# Patient Record
Sex: Male | Born: 1950 | Race: White | Hispanic: No | Marital: Married | Smoking: Never smoker
Health system: Southern US, Community
[De-identification: ages and names within clinical notes are randomized; demographics above are authoritative.]

## PROBLEM LIST (undated history)

## (undated) DIAGNOSIS — E119 Type 2 diabetes mellitus without complications: Secondary | ICD-10-CM

## (undated) DIAGNOSIS — I10 Essential (primary) hypertension: Secondary | ICD-10-CM

## (undated) DIAGNOSIS — M329 Systemic lupus erythematosus, unspecified: Secondary | ICD-10-CM

## (undated) DIAGNOSIS — N289 Disorder of kidney and ureter, unspecified: Secondary | ICD-10-CM

## (undated) DIAGNOSIS — IMO0002 Reserved for concepts with insufficient information to code with codable children: Secondary | ICD-10-CM

## (undated) HISTORY — DX: Disorder of kidney and ureter, unspecified: N28.9

## (undated) HISTORY — PX: CHOLECYSTECTOMY: SHX55

## (undated) HISTORY — PX: GALLBLADDER SURGERY: SHX652

## (undated) HISTORY — DX: Essential (primary) hypertension: I10

## (undated) HISTORY — PX: OTHER SURGICAL HISTORY: SHX169

## (undated) HISTORY — PX: LITHOTRIPSY: SUR834

## (undated) HISTORY — DX: Type 2 diabetes mellitus without complications: E11.9

---

## 2007-04-19 ENCOUNTER — Emergency Department (HOSPITAL_COMMUNITY): Admission: EM | Admit: 2007-04-19 | Discharge: 2007-04-19 | Payer: Self-pay | Admitting: Emergency Medicine

## 2007-04-20 ENCOUNTER — Ambulatory Visit (HOSPITAL_COMMUNITY): Admission: RE | Admit: 2007-04-20 | Discharge: 2007-04-20 | Payer: Self-pay | Admitting: Emergency Medicine

## 2007-11-10 ENCOUNTER — Ambulatory Visit (HOSPITAL_COMMUNITY): Admission: RE | Admit: 2007-11-10 | Discharge: 2007-11-10 | Payer: Self-pay | Admitting: Urology

## 2007-11-19 ENCOUNTER — Ambulatory Visit (HOSPITAL_COMMUNITY): Admission: RE | Admit: 2007-11-19 | Discharge: 2007-11-19 | Payer: Self-pay | Admitting: Urology

## 2007-11-24 ENCOUNTER — Ambulatory Visit (HOSPITAL_COMMUNITY): Admission: RE | Admit: 2007-11-24 | Discharge: 2007-11-24 | Payer: Self-pay | Admitting: Urology

## 2007-11-26 ENCOUNTER — Ambulatory Visit: Payer: Self-pay | Admitting: Internal Medicine

## 2007-11-26 DIAGNOSIS — Z87442 Personal history of urinary calculi: Secondary | ICD-10-CM

## 2007-11-26 DIAGNOSIS — R918 Other nonspecific abnormal finding of lung field: Secondary | ICD-10-CM

## 2007-11-26 DIAGNOSIS — I10 Essential (primary) hypertension: Secondary | ICD-10-CM

## 2007-11-26 DIAGNOSIS — E785 Hyperlipidemia, unspecified: Secondary | ICD-10-CM | POA: Insufficient documentation

## 2007-12-07 ENCOUNTER — Emergency Department (HOSPITAL_COMMUNITY): Admission: EM | Admit: 2007-12-07 | Discharge: 2007-12-07 | Payer: Self-pay | Admitting: Emergency Medicine

## 2007-12-19 ENCOUNTER — Ambulatory Visit (HOSPITAL_COMMUNITY): Admission: RE | Admit: 2007-12-19 | Discharge: 2007-12-19 | Payer: Self-pay | Admitting: Urology

## 2007-12-29 ENCOUNTER — Ambulatory Visit (HOSPITAL_COMMUNITY): Admission: RE | Admit: 2007-12-29 | Discharge: 2007-12-29 | Payer: Self-pay | Admitting: Urology

## 2008-01-14 ENCOUNTER — Ambulatory Visit (HOSPITAL_COMMUNITY): Admission: RE | Admit: 2008-01-14 | Discharge: 2008-01-14 | Payer: Self-pay | Admitting: Urology

## 2008-01-16 ENCOUNTER — Ambulatory Visit (HOSPITAL_COMMUNITY): Admission: RE | Admit: 2008-01-16 | Discharge: 2008-01-16 | Payer: Self-pay | Admitting: Urology

## 2008-04-20 ENCOUNTER — Encounter: Payer: Self-pay | Admitting: Internal Medicine

## 2008-10-18 ENCOUNTER — Emergency Department (HOSPITAL_COMMUNITY): Admission: EM | Admit: 2008-10-18 | Discharge: 2008-10-18 | Payer: Self-pay | Admitting: Emergency Medicine

## 2009-02-07 ENCOUNTER — Ambulatory Visit: Payer: Self-pay | Admitting: Internal Medicine

## 2009-02-07 IMAGING — CR DG ABDOMEN 1V
1 series · 1 of 1 positions shown · non-contrast
Comparison: 01/14/2008

CLINICAL DATA: Right kidney stone.  Lithotripsy.

ABDOMEN - 1 VIEW

[view not recorded]
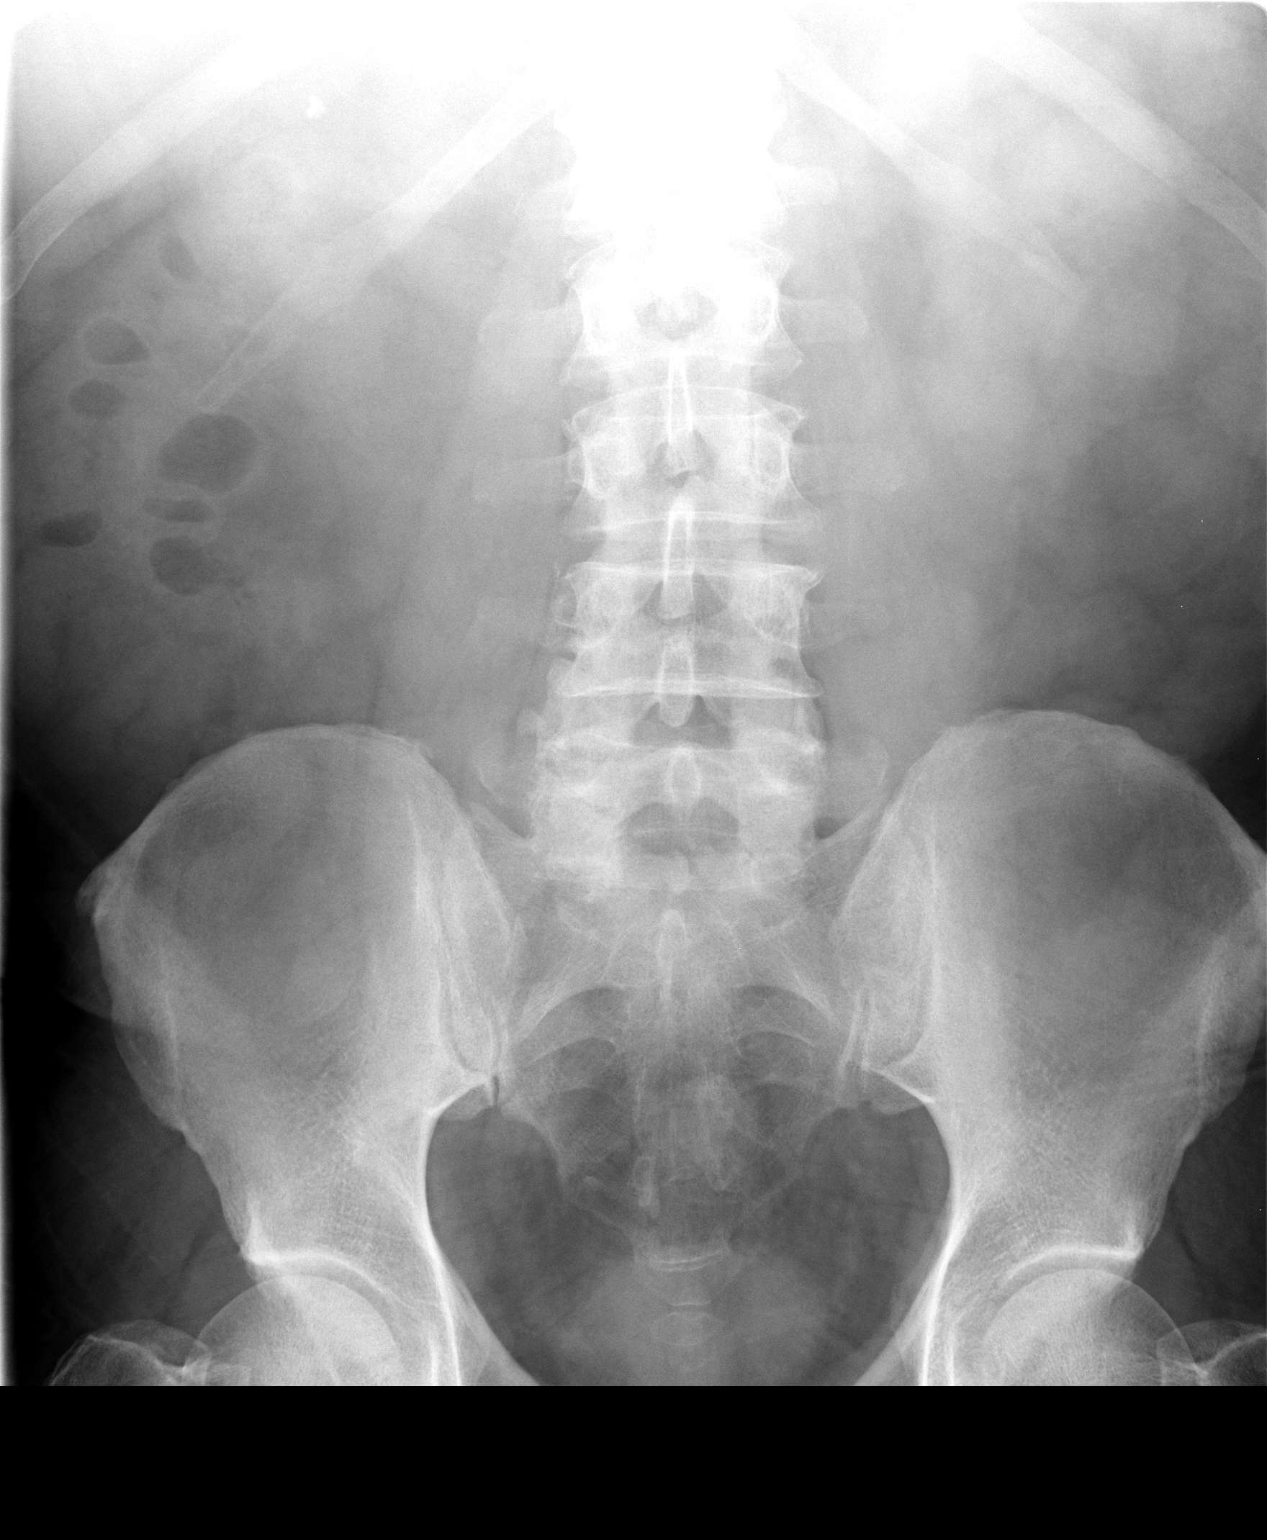

[1 of 1 positions shown; findings below may reference images not displayed]

FINDINGS: Right interpolar renal calculus is present, which has
fragmented compared to the prior exam of [DATE].   In aggregate, the
fragments measure 6 mm x 4 mm, previously measured a 7 mm x 4 mm.
No definite ureteral stone is identified bony spur is again noted
superior to the right L5 transverse process.
IMPRESSION: 1.  Fragmentation of right interpolar renal collecting systems
stones.

## 2010-08-29 LAB — URINALYSIS, ROUTINE W REFLEX MICROSCOPIC
Bilirubin Urine: NEGATIVE
Glucose, UA: NEGATIVE mg/dL
Hgb urine dipstick: NEGATIVE
Ketones, ur: NEGATIVE mg/dL
Protein, ur: NEGATIVE mg/dL

## 2010-08-29 LAB — DIFFERENTIAL
Basophils Relative: 1 % (ref 0–1)
Eosinophils Relative: 5 % (ref 0–5)
Lymphs Abs: 3.4 10*3/uL (ref 0.7–4.0)
Monocytes Relative: 8 % (ref 3–12)
Neutrophils Relative %: 39 % — ABNORMAL LOW (ref 43–77)

## 2010-08-29 LAB — CBC
HCT: 48.5 % (ref 39.0–52.0)
Hemoglobin: 16.5 g/dL (ref 13.0–17.0)
RBC: 5.63 MIL/uL (ref 4.22–5.81)
RDW: 13.1 % (ref 11.5–15.5)
WBC: 7.1 10*3/uL (ref 4.0–10.5)

## 2010-08-29 LAB — COMPREHENSIVE METABOLIC PANEL
ALT: 48 U/L (ref 0–53)
Albumin: 3.8 g/dL (ref 3.5–5.2)
GFR calc Af Amer: >60 mL/min (ref >60)
GFR calc non Af Amer: >60 mL/min (ref >60)
Sodium: 143 mEq/L (ref 135–145)

## 2010-08-29 LAB — URINE MICROSCOPIC-ADD ON

## 2010-08-29 LAB — LIPASE, BLOOD: Lipase: 20 U/L (ref 11–59)

## 2010-10-03 NOTE — H&P (Signed)
[name], [name]             ACCOUNT NO.:  [id number]   MEDICAL RECORD NO.:  [id number]          PATIENT TYPE:  AMB   LOCATION:  DAY                           FACILITY:  [facility]   PHYSICIAN:  [name], M.D.   DATE OF BIRTH:  December 31, 1950   DATE OF ADMISSION:  01/14/2008  DATE OF DISCHARGE:  LH                              HISTORY & PHYSICAL   CHIEF COMPLAINT:  Intermittent right flank pain, hematuria, right renal  calculus.   HISTORY OF PRESENT ILLNESS:  This 60 year old male was referred to me by  Dr. Sherril Vargas.  He has a history of recurrent urolithiasis for about 23  years.  He has passed several stones.  He was evaluated for intermittent  gross hematuria and right flank pain a few months ago.  He had 2 stones  in the right kidney.  Obstructing right lower pole renal calculus has  been treated with ESL in July 2009.  The stone has been fragmented well  and the patient has passed all the stone fragments.  He has another  stone 8 mm in size in the right upper pole of the kidney.  The patient  is brought to the short-stay center today for extracorporeal shock-wave  lithotripsy of the upper pole right renal calculus.  The patient denied  having any fever, chills, voiding difficulty or gross hematuria at  present.   PAST MEDICAL HISTORY:  1. Recurrent urolithiasis status post right nephrostolithotomy 23      years ago.  2. Status post ESL of right lower pole renal calculus in July 2009.  3. History of borderline  hypertension.   MEDICATIONS:  None.   ALLERGIES:  None.   EXAMINATION:  Height 5 feet 8, weight 198 pounds.  HEAD, EYES, EARS, NOSE AND THROAT: Normal.  LUNGS:  Clear to auscultation.  HEART: Regular rate and rhythm.  No murmurs.  ABDOMEN:  Soft, no palpable flank mass or CVA tenderness.  Bladder is  not palpable.  Penis normal.  Right testis is of normal size.  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  RECTAL EXAMINATION:  33 g size benign prostate.   IMPRESSION:  Right renal calculus, 8 mm.   PLAN:  Extracorporeal shock-wave lithotripsy of the right renal calculi  with IV sedation in short-stay center.  I have informed the patient  regarding the diagnosis, operative details, alternative treatments,  outcome, the possible risks and complications and he has agreed for the  procedure to be done.      [name], M.D.  Electronically Signed     SK/MEDQ  D:  01/14/2008  T:  01/14/2008  Job:  [id number]   cc:   Dr. Sherril Vargas

## 2010-10-03 NOTE — H&P (Signed)
[name], [name]             ACCOUNT NO.:  [id number]   MEDICAL RECORD NO.:  [id number]          PATIENT TYPE:  AMB   LOCATION:  DAY                           FACILITY:  [facility]   PHYSICIAN:  Dennie Maizes, M.D.   DATE OF BIRTH:  1950/10/14   DATE OF ADMISSION:  11/19/2007  DATE OF DISCHARGE:  LH                              HISTORY & PHYSICAL   CHIEF COMPLAINT:  Intermittent right flank pain, hematuria, large right  renal calculus.   HISTORY OF PRESENT ILLNESS:  This 60 year old male was referred to me by  Dr. Sherril Croon.  He has a history of recurrent urolithiasis for about 23  years.  He has passed several stones.  He passed a stone about 2 months  ago.  He has undergone right kidney surgery for stones about 25 years  ago.   He has been having intermittent right flank pain of moderate severity  for the past 6 months.  He has noticed intermittent mild hematuria for a  few weeks.  He denied having any voiding difficulty, dysuria, fever, or  chills.  He has urinary frequency x3-4 and nocturia x0.  He has good  urinary flow.  There is no past history of chronic prostatitis or  recurrent urinary tract infections.   PAST MEDICAL HISTORY:  1. Recurrent urolithiasis, status post right nephrostolithotomy 25      years ago.  2. Borderline hypertension.   MEDICATIONS:  None.   ALLERGIES:  None.   FAMILY HISTORY:  Positive for carcinoma of unknown site.   PHYSICAL EXAMINATION:  VITAL SIGNS:  Height 5 feet 8 inches, weight 198  pounds.  HEENT:  Normal.  LUNGS:  Clear to auscultation.  HEART:  Regular rate and rhythm.  No murmurs.  ABDOMEN:  Soft.  There is no palpable flank mass or costovertebral angle  tenderness.  Bladder is not palpable.  GU:  Penis normal.  Right testis is of normal size.  Left testis is  atrophic due to childhood mumps.  RECTAL:  33 g benign prostate.   Further evaluation was done with a CT scan of the abdomen and pelvis  with and without contrast.  This  revealed a 13 x 13 mm size right renal  stone with mild obstruction.  There is a 4 mm size nonobstructing stone  that was also noted in the upper pole of the right kidney.   IMPRESSION:  1. Right renal calculi.  2. Right flank pain.  3. Hematuria.   PLAN:  I have discussed with the patient regarding the diagnosis and  management options.  He is scheduled to undergo extracorporeal shock  wave lithotripsy of the large obstructing right renal calculus.  I have  informed him regarding the diagnosis, operative details, alternative  treatments, outcome, possible risks, and complications, and has agreed  for the procedure to be done.  He may need an additional procedure for  obstructing stone fragments, and this has been explained to the patient.      Dennie Maizes, M.D.  Electronically Signed     SK/MEDQ  D:  11/19/2007  T:  11/19/2007  Job:  [id number]   cc:   Doreen Beam, MD  Fax: [phone]   [facility]  Fax: [phone]

## 2011-02-27 LAB — LIPASE, BLOOD: Lipase: 21

## 2011-02-27 LAB — DIFFERENTIAL
Basophils Absolute: 0
Basophils Relative: 0
Eosinophils Absolute: 0.3
Eosinophils Relative: 4
Lymphocytes Relative: 28
Lymphs Abs: 2.8
Monocytes Absolute: 0.7
Monocytes Relative: 7
Neutro Abs: 6

## 2011-02-27 LAB — URINE MICROSCOPIC-ADD ON

## 2011-02-27 LAB — CBC
Hemoglobin: 16.3
MCV: 86.2
RDW: 13

## 2011-02-27 LAB — URINALYSIS, ROUTINE W REFLEX MICROSCOPIC
Urobilinogen, UA: 0.2
pH: 6

## 2011-02-27 LAB — BASIC METABOLIC PANEL
BUN: 13
Chloride: 101
GFR calc Af Amer: >60
Potassium: 4

## 2013-09-25 ENCOUNTER — Institutional Professional Consult (permissible substitution): Payer: Self-pay | Admitting: Internal Medicine

## 2014-04-21 ENCOUNTER — Ambulatory Visit: Payer: Self-pay | Admitting: Ophthalmology

## 2014-09-11 NOTE — Op Note (Signed)
PATIENT NAME:  [name], [name] MR#:  [id number] DATE OF BIRTH:  1951-05-13  DATE OF PROCEDURE:  04/21/2014  PREOPERATIVE DIAGNOSES:   1.  Rhegmatogenous retinal detachment, left eye.  2.  Retinal tear, right eye.  POSTOPERATIVE DIAGNOSES:  1.  Rhegmatogenous retinal detachment, left eye.  2.  Retinal tear, right eye.  PROCEDURE:  1.  A 23-gauge pars plana vitrectomy with membrane peeling, triamcinolone, air-fluid exchange, endolaser and 28% SF6 in the left eye.  2.  Laser barricade for retinal tear, right eye.  DESCRIPTION OF PROCEDURE: The patient was examined in the clinic for chronic- appearing retinal detachment in the left eye. 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 There was no active retinopathy and the patient did have some peripheral laser in place. The patient was noted to have a temporal macula off chronic- appearing retinal detachment. Of note, on exam there was also a retinal tear in the left eye. Risks, benefits, alternatives, and complications were discussed with patient, decision was made to proceed with pars plana vitrectomy of the left eye and laser barricade of the right eye.   On the day of procedure, the patient was greeted in the preoperative holding area. The left eye was marked for surgery and the right eye was marked for laser only. Any questions were answered. The patient was brought into the operating room in supine position and placed under general anesthesia. The left eye was then prepped and draped in the usual sterile fashion. A 25-gauge trocar pack was used. The 3 trocars were placed in the usual position. The inferotemporal trocar was checked to make sure infusion was in the vitreous cavity before starting the infusion. A core vitrectomy was performed. Attention was turned to the posterior hyaloid which was then elevated off of the optic nerve and it was noted there were several points of the attachment of the  posterior hyaloid to the retina temporally along the macular arcades. The peripheral vitreous, which was opacified, was then trimmed 360 degrees. The 2 retinal holes were identified and marked and fluid-fluid exchange was performed to drain some of this retinal fluid underneath and then air-fluid exchange was used to flatten the retina. Laser was applied around both off the retinal tears and peripheral panretinal photocoagulation endolaser was applied 360 degrees to  the patient's retina given his history of central retinal vein occlusion and a few small peripheral hemorrhages. Four times the vitreous volume of 28% SF6 was infused into the eye followed by removal of the trocars. Subconjunctival cefuroxime and dexamethasone were injected followed by Neo-Poly-Dex ointment and the patient was patched and shielded.  Attention was drawn to the right eye in which indirect laser barricade was applied to the retinal tear. The retina was inspected 360 degrees with scleral depression. There were no other retinal tears. The patient was brought out of general anesthesia and taken to recovery area in stable condition.    ____________________________ Laban Emperor Oval Linsey, MD jdr:LT D: 04/21/2014 17:41:19 ET T: 04/21/2014 19:35:45 ET JOB#: [id number]  cc: Janett Billow D. Oval Linsey, MD, <Dictator> Alexia Freestone MD ELECTRONICALLY SIGNED 04/28/2014 16:47

## 2014-11-30 ENCOUNTER — Encounter (INDEPENDENT_AMBULATORY_CARE_PROVIDER_SITE_OTHER): Payer: Self-pay | Admitting: *Deleted

## 2014-12-09 ENCOUNTER — Other Ambulatory Visit (INDEPENDENT_AMBULATORY_CARE_PROVIDER_SITE_OTHER): Payer: Self-pay | Admitting: *Deleted

## 2014-12-09 ENCOUNTER — Encounter (INDEPENDENT_AMBULATORY_CARE_PROVIDER_SITE_OTHER): Payer: Self-pay | Admitting: *Deleted

## 2014-12-09 DIAGNOSIS — Z1211 Encounter for screening for malignant neoplasm of colon: Secondary | ICD-10-CM

## 2014-12-09 DIAGNOSIS — Z800 Family history of malignant neoplasm of digestive organs: Secondary | ICD-10-CM

## 2014-12-14 ENCOUNTER — Ambulatory Visit (INDEPENDENT_AMBULATORY_CARE_PROVIDER_SITE_OTHER)
Admission: RE | Admit: 2014-12-14 | Discharge: 2014-12-14 | Disposition: A | Discharge status: Home / Self Care | Payer: Medicare HMO | Source: Ambulatory Visit | Attending: Internal Medicine | Admitting: Internal Medicine

## 2014-12-14 ENCOUNTER — Ambulatory Visit (INDEPENDENT_AMBULATORY_CARE_PROVIDER_SITE_OTHER): Payer: Medicare HMO | Admitting: Internal Medicine

## 2014-12-14 ENCOUNTER — Encounter: Payer: Self-pay | Admitting: Internal Medicine

## 2014-12-14 ENCOUNTER — Other Ambulatory Visit (INDEPENDENT_AMBULATORY_CARE_PROVIDER_SITE_OTHER): Payer: Medicare HMO

## 2014-12-14 VITALS — BP 160/82 | HR 72 | Ht 68.0 in | Wt 208.0 lb

## 2014-12-14 DIAGNOSIS — I10 Essential (primary) hypertension: Secondary | ICD-10-CM | POA: Diagnosis not present

## 2014-12-14 DIAGNOSIS — R058 Other specified cough: Secondary | ICD-10-CM

## 2014-12-14 DIAGNOSIS — R918 Other nonspecific abnormal finding of lung field: Secondary | ICD-10-CM

## 2014-12-14 DIAGNOSIS — R05 Cough: Secondary | ICD-10-CM

## 2014-12-14 LAB — CBC WITH DIFFERENTIAL/PLATELET
BASOS ABS: 0 10*3/uL (ref 0.0–0.1)
BASOS PCT: 0.2 % (ref 0.0–3.0)
Eosinophils Absolute: 0.3 10*3/uL (ref 0.0–0.7)
Eosinophils Relative: 3.1 % (ref 0.0–5.0)
HEMATOCRIT: 47.4 % (ref 39.0–52.0)
Hemoglobin: 16.1 g/dL (ref 13.0–17.0)
LYMPHS ABS: 2.6 10*3/uL (ref 0.7–4.0)
Lymphocytes Relative: 32.5 % (ref 12.0–46.0)
MCHC: 33.9 g/dL (ref 30.0–36.0)
MCV: 85.3 fl (ref 78.0–100.0)
MONOS PCT: 7.8 % (ref 3.0–12.0)
Monocytes Absolute: 0.6 10*3/uL (ref 0.1–1.0)
NEUTROS PCT: 56.4 % (ref 43.0–77.0)
Neutro Abs: 4.6 10*3/uL (ref 1.4–7.7)
Platelets: 180 10*3/uL (ref 150.0–400.0)
RBC: 5.56 Mil/uL (ref 4.22–5.81)
RDW: 13.3 % (ref 11.5–15.5)
WBC: 8.1 10*3/uL (ref 4.0–10.5)

## 2014-12-14 LAB — BASIC METABOLIC PANEL
BUN: 13 mg/dL (ref 6–23)
CHLORIDE: 104 meq/L (ref 96–112)
CO2: 28 meq/L (ref 19–32)
Calcium: 10.2 mg/dL (ref 8.4–10.5)
Creatinine, Ser: 1.15 mg/dL (ref 0.40–1.50)
GFR: 67.56 mL/min (ref >60.00)
Glucose, Bld: 91 mg/dL (ref 70–99)
POTASSIUM: 4 meq/L (ref 3.5–5.1)
Sodium: 140 mEq/L (ref 135–145)

## 2014-12-14 MED ORDER — VALSARTAN-HYDROCHLOROTHIAZIDE 160-12.5 MG PO TABS: 1.0000 | ORAL_TABLET | Freq: Every day | ORAL | Status: DC

## 2014-12-14 NOTE — Patient Instructions (Signed)
Stop lisinopril and start valsartan 160 12.5 one daily   Please remember to go to the lab and x-ray department downstairs for your tests - we will call you with the results when they are available.     If not 100% better return in 6 weeks, if better tell your friends!!!

## 2014-12-14 NOTE — Progress Notes (Signed)
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 no comparison films available.  rec f/u cxr never done, referred himself back to pulmonary clinic 12/14/2014 for cough on ACEi     12/14/2014 1st [facility] office visit/ [name] [facility] era  Chief Complaint  Patient presents with  . Pulmonary Consult    Self referral. Pt states worsening SOB at all times. Also c/o productive cough at times with thick gray mucus. Pt's spouse states some wheezing at night too   on acei since first of 2016 with onset of cough spring 2015 and noct wheezing per wife but not per pt  Tickle in throat but not prod of significant excess/ purulent sputum, variable sob but not proportionate to ex   No obvious day to day or daytime variability or assoc  cp or chest tightness, or overt sinus or hb symptoms. No unusual exp hx or h/o childhood pna/ asthma or knowledge of premature birth.  Sleeping ok without nocturnal  or early am exacerbation  of respiratory  c/o's or need for noct saba. Also denies any obvious fluctuation of symptoms with weather or environmental changes or other aggravating or alleviating factors except as outlined above   Current Medications, Allergies, Complete Past Medical History, Past Surgical History, Family History, and Social History were reviewed in [employer] record.  ROS  The following are not active complaints unless bolded sore throat, dysphagia, dental problems, itching, sneezing,  nasal congestion or excess/ purulent secretions, ear ache,   fever, chills, sweats, unintended wt loss, classically pleuritic or exertional cp, hemoptysis,  orthopnea pnd or leg swelling,  presyncope, palpitations, abdominal pain, anorexia, nausea, vomiting, diarrhea  or change in bowel or bladder habits, change in stools or urine, dysuria,hematuria,  rash, arthralgias, visual complaints, headache, numbness, weakness or ataxia or problems with walking or coordination,  change in mood/affect or memory.            Review of Systems  Respiratory: Positive for cough.        Objective:   Physical Exam    amb moderately hoarse wm nad  Wt Readings from Last 3 Encounters:  12/14/14 208 lb (94.348 kg)  02/07/09 205 lb 8 oz (93.214 kg)  11/26/07 205 lb 4 oz (93.101 kg)    Vital signs reviewed  HEENT: nl dentition, turbinates, and orophanx. Nl external ear canals without cough reflex   NECK :  without JVD/Nodes/TM/ nl carotid upstrokes bilaterally   LUNGS: no acc muscle use, clear to A and P bilaterally without cough on insp or exp maneuvers   CV:  RRR  no s3 or murmur or increase in P2, no edema   ABD:  soft and nontender with nl excursion in the supine position. No bruits or organomegaly, bowel sounds nl  MS:  warm without deformities, calf tenderness, cyanosis or clubbing  SKIN: warm and dry without lesions    NEURO:  alert, approp, no deficits    CXR PA and Lateral:   12/14/2014 :     I personally reviewed images and agree with radiology impression as follows:    No active cardiopulmonary disease.  Labs ordered/ reviewed:  Lab 12/14/14 1559  NA 140  K 4.0  CL 104  CO2 28  BUN 13  CREATININE 1.15  GLUCOSE 91   Lab 12/14/14 1559  HGB 16.1  HCT 47.4  WBC 8.1  PLT 180.0          Assessment & Plan:

## 2014-12-15 ENCOUNTER — Telehealth: Payer: Self-pay | Admitting: Internal Medicine

## 2014-12-15 ENCOUNTER — Encounter: Payer: Self-pay | Admitting: Internal Medicine

## 2014-12-15 NOTE — Telephone Encounter (Signed)
PT informed of lab and cxr results per Dr Melvyn Novas.

## 2014-12-15 NOTE — Assessment & Plan Note (Signed)
ACE inhibitors are problematic in  pts with airway complaints because  even experienced pulmonologists can't always distinguish ace effects from copd/asthma.  By themselves they don't actually cause a problem, much like oxygen can't by itself start a fire, but they certainly serve as a powerful catalyst or enhancer for any "fire"  or inflammatory process in the upper airway, be it caused by an ET  tube or more commonly reflux (especially in the obese or pts with known GERD or who are on biphoshonates).    In the era of ARB near equivalency until we have a better handle on the reversibility of the airway problem, it just makes sense to avoid ACEI  entirely in the short run and then decide later, having established a level of airway control using a reasonable limited regimen, whether to add back ace but even then being very careful to observe the pt for worsening airway control and number of meds used/ needed to control symptoms.    rec trial of diovan 160 12.5 and f/u primary care if cough gone, here if not

## 2014-12-15 NOTE — Assessment & Plan Note (Signed)
The most common causes of chronic cough in immunocompetent adults include the following: upper airway cough syndrome (UACS), previously referred to as postnasal drip syndrome (PNDS), which is caused by variety of rhinosinus conditions; (2) asthma; (3) GERD; (4) chronic bronchitis from cigarette smoking or other inhaled environmental irritants; (5) nonasthmatic eosinophilic bronchitis; and (6) bronchiectasis.   These conditions, singly or in combination, have accounted for up to 94% of the causes of chronic cough in prospective studies.   Other conditions have constituted no >6% of the causes in prospective studies These have included bronchogenic carcinoma, chronic interstitial pneumonia, sarcoidosis, left ventricular failure, ACEI-induced cough, and aspiration from a condition associated with pharyngeal dysfunction.    Chronic cough is often simultaneously caused by more than one condition. A single cause has been found from 38 to 82% of the time, multiple causes from 18 to 62%. Multiply caused cough has been the result of three diseases up to 42% of the time.       Based on hx and exam, this is most likely:  Classic Upper airway cough syndrome, so named because it's frequently impossible to sort out how much is  CR/sinusitis with freq throat clearing (which can be related to primary GERD)   vs  causing  secondary (" extra esophageal")  GERD from wide swings in gastric pressure that occur with throat clearing, often  promoting self use of mint and menthol lozenges that reduce the lower esophageal sphincter tone and exacerbate the problem further in a cyclical fashion.   These are the same pts (now being labeled as having "irritable larynx syndrome" by some cough centers) who not infrequently have a history of having failed to tolerate ace inhibitors,  dry powder inhalers or biphosphonates or report having atypical reflux symptoms that don't respond to standard doses of PPI , and are easily confused as  having aecopd or asthma flares by even experienced allergists/ pulmonologists.   The first step is to   eliminate ACEi  then regroup if the cough persists.  See instructions for specific recommendations which were reviewed directly with the patient who was given a copy with highlighter outlining the key components.

## 2014-12-15 NOTE — Assessment & Plan Note (Signed)
See CT chest 2009 x 8 mm  - not viz on plain cxr 12/14/14 / never smoker > no f/u rec

## 2015-01-20 ENCOUNTER — Telehealth (INDEPENDENT_AMBULATORY_CARE_PROVIDER_SITE_OTHER): Payer: Self-pay | Admitting: *Deleted

## 2015-01-20 DIAGNOSIS — Z1211 Encounter for screening for malignant neoplasm of colon: Secondary | ICD-10-CM

## 2015-01-20 MED ORDER — SUPREP BOWEL PREP KIT 17.5-3.13-1.6 GM/177ML PO SOLN: 1.0000 | Freq: Once | ORAL | Status: DC

## 2015-01-20 NOTE — Telephone Encounter (Signed)
Patient needs suprep 

## 2015-01-28 ENCOUNTER — Telehealth (INDEPENDENT_AMBULATORY_CARE_PROVIDER_SITE_OTHER): Payer: Self-pay | Admitting: *Deleted

## 2015-01-28 NOTE — Telephone Encounter (Signed)
Referring MD/PCP: shah   Procedure: tcs  Reason/Indication:  Screening, fam hx colon ca  Has patient had this procedure before?  no  If so, when, by whom and where?    Is there a family history of colon cancer?  Yes, paternal uncle  Who?  What age when diagnosed?    Is patient diabetic?   no      Does patient have prosthetic heart valve?  no  Do you have a pacemaker?  no  Has patient ever had endocarditis? no  Has patient had joint replacement within last 12 months?  no  Does patient tend to be constipated or take laxatives? no  Does patient have a history of alcohol/drug use? no  Is patient on Coumadin, Plavix and/or Aspirin? no  Medications: lisinopril/hctz 10/12.5 mg daily, plaquenil 200 mg bid  Allergies: nkda  Medication Adjustment:   Procedure date & time: 02/23/15 at 855

## 2015-02-01 NOTE — Telephone Encounter (Signed)
agree

## 2015-02-23 ENCOUNTER — Ambulatory Visit (HOSPITAL_COMMUNITY)
Admission: RE | Admit: 2015-02-23 | Discharge: 2015-02-23 | Disposition: A | Discharge status: Home / Self Care | Payer: Medicare HMO | Source: Ambulatory Visit | Attending: Internal Medicine | Admitting: Internal Medicine

## 2015-02-23 ENCOUNTER — Encounter (HOSPITAL_COMMUNITY): Payer: Self-pay | Admitting: *Deleted

## 2015-02-23 ENCOUNTER — Encounter (HOSPITAL_COMMUNITY)
Admission: RE | Disposition: A | Discharge status: Home / Self Care | Payer: Self-pay | Source: Ambulatory Visit | Attending: Internal Medicine

## 2015-02-23 DIAGNOSIS — D125 Benign neoplasm of sigmoid colon: Secondary | ICD-10-CM | POA: Diagnosis not present

## 2015-02-23 DIAGNOSIS — K644 Residual hemorrhoidal skin tags: Secondary | ICD-10-CM | POA: Insufficient documentation

## 2015-02-23 DIAGNOSIS — Z800 Family history of malignant neoplasm of digestive organs: Secondary | ICD-10-CM | POA: Insufficient documentation

## 2015-02-23 DIAGNOSIS — Z79899 Other long term (current) drug therapy: Secondary | ICD-10-CM | POA: Diagnosis not present

## 2015-02-23 DIAGNOSIS — Z1211 Encounter for screening for malignant neoplasm of colon: Secondary | ICD-10-CM | POA: Insufficient documentation

## 2015-02-23 DIAGNOSIS — I10 Essential (primary) hypertension: Secondary | ICD-10-CM | POA: Insufficient documentation

## 2015-02-23 DIAGNOSIS — K648 Other hemorrhoids: Secondary | ICD-10-CM | POA: Diagnosis not present

## 2015-02-23 DIAGNOSIS — D123 Benign neoplasm of transverse colon: Secondary | ICD-10-CM | POA: Diagnosis not present

## 2015-02-23 HISTORY — DX: Systemic lupus erythematosus, unspecified: M32.9

## 2015-02-23 HISTORY — DX: Reserved for concepts with insufficient information to code with codable children: IMO0002

## 2015-02-23 HISTORY — PX: COLONOSCOPY: SHX5424

## 2015-02-23 SURGERY — COLONOSCOPY
Anesthesia: Moderate Sedation

## 2015-02-23 MED ORDER — MIDAZOLAM HCL 5 MG/5ML IJ SOLN
INTRAMUSCULAR | Status: DC | PRN
  Administered 2015-02-23: 2 mg via INTRAVENOUS
  Administered 2015-02-23: 1 mg via INTRAVENOUS
  Administered 2015-02-23: 2 mg via INTRAVENOUS

## 2015-02-23 MED ORDER — MEPERIDINE HCL 50 MG/ML IJ SOLN
INTRAMUSCULAR | Status: DC | PRN
  Administered 2015-02-23 (×2): 25 mg via INTRAVENOUS

## 2015-02-23 MED ORDER — STERILE WATER FOR IRRIGATION IR SOLN
Status: DC | PRN
  Administered 2015-02-23: 09:00:00

## 2015-02-23 MED ORDER — MIDAZOLAM HCL 5 MG/5ML IJ SOLN
INTRAMUSCULAR | Status: AC
  Filled 2015-02-23: qty 10

## 2015-02-23 MED ORDER — SODIUM CHLORIDE 0.9 % IV SOLN
INTRAVENOUS | Status: DC
  Administered 2015-02-23: 08:00:00 via INTRAVENOUS

## 2015-02-23 MED ORDER — MEPERIDINE HCL 50 MG/ML IJ SOLN
INTRAMUSCULAR | Status: AC
  Filled 2015-02-23: qty 1

## 2015-02-23 NOTE — Op Note (Addendum)
COLONOSCOPY PROCEDURE REPORT  PATIENT:  FLORENTINO LAABS  MR#:  [id number] Birthdate:  1950/09/23, 64 y.o., male Endoscopist:  Dr. Rogene Houston, MD Referred By:  Dr. Monico Blitz, MD  Procedure Date: 02/23/2015  Procedure:   Colonoscopy with snare polypectomy  Indications:  Patient is 64 year old Caucasian male was undergoing average risk screening colonoscopy. This is patient's first exam.  Informed Consent:  The procedure and risks were reviewed with the patient and informed consent was obtained.  Medications:  Demerol 50 mg IV Versed 5 mg IV  Description of procedure:  After a digital rectal exam was performed, that colonoscope was advanced from the anus through the rectum and colon to the area of the cecum, ileocecal valve and appendiceal orifice. The cecum was deeply intubated. These structures were well-seen and photographed for the record. From the level of the cecum and ileocecal valve, the scope was slowly and cautiously withdrawn. The mucosal surfaces were carefully surveyed utilizing scope tip to flexion to facilitate fold flattening as needed. The scope was pulled down into the rectum where a thorough exam including retroflexion was performed.  Findings:   Prep excellent. 4 mm polyp ablated via cold biopsy from splenic flexure. 5 mm polyp cold snared from proximal sigmoid colon. 7 mm polyp hot snared from distal sigmoid colon. Normal rectal mucosa. Small hemorrhoids below the dentate line and a tiny anal papilla.   Therapeutic/Diagnostic Maneuvers Performed:  See above  Complications: None  EBL: None  Cecal Withdrawal Time:  22 minutes  Impression:  Examination performed to cecum. 4 mm polyp removed with cold biopsy forceps from splenic flexure 5 mm polyp cold snared from proximal sigmoid colon. 7 mm  polyp hot snared from distal sigmoid colon. All three polyps were submitted together. Small external hemorrhoids and single anal papilla.  Recommendations:   Standard instructions given. I will contact patient with biopsy results and further recommendations.  Jazmine Heckman U  02/23/2015 9:38 AM  CC: Dr. Monico Blitz, MD & Dr. Rayne Du ref. provider found

## 2015-02-23 NOTE — Discharge Instructions (Signed)
No aspirin or NSAIDs for 1 week. Resume usual medications and diet. No driving for 24 hours. Physician will call with biopsy results.  Colonoscopy, Care After These instructions give you information on caring for yourself after your procedure. Your doctor may also give you more specific instructions. Call your doctor if you have any problems or questions after your procedure. HOME CARE  Do not drive for 24 hours.  Do not sign important papers or use machinery for 24 hours.  You may shower.  You may go back to your usual activities, but go slower for the first 24 hours.  Take rest breaks often during the first 24 hours.  Walk around or use warm packs on your belly (abdomen) if you have belly cramping or gas.  Drink enough fluids to keep your pee (urine) clear or pale yellow.  Resume your normal diet. Avoid heavy or fried foods.  Avoid drinking alcohol for 24 hours or as told by your doctor.  Only take medicines as told by your doctor. If a tissue sample (biopsy) was taken during the procedure:   Do not take aspirin or blood thinners for 7 days, or as told by your doctor.  Do not drink alcohol for 7 days, or as told by your doctor.  Eat soft foods for the first 24 hours. GET HELP IF: You still have a small amount of blood in your poop (stool) 2-3 days after the procedure. GET HELP RIGHT AWAY IF:  You have more than a small amount of blood in your poop.  You see clumps of tissue (blood clots) in your poop.  Your belly is puffy (swollen).  You feel sick to your stomach (nauseous) or throw up (vomit).  You have a fever.  You have belly pain that gets worse and medicine does not help. MAKE SURE YOU:  Understand these instructions.  Will watch your condition.  Will get help right away if you are not doing well or get worse.   This information is not intended to replace advice given to you by your health care provider. Make sure you discuss any questions you have  with your health care provider.   Document Released: 06/09/2010 Document Revised: 05/12/2013 Document Reviewed: 01/12/2013 Elsevier Interactive Patient Education [employer].

## 2015-02-23 NOTE — H&P (Signed)
[name] is an 64 y.o. male.   Chief Complaint: Patient is here for colonoscopy. HPI: Patient is 64 year old Caucasian male who is here for screening colonoscopy. He denies abdominal pain change in bowel habits or rectal bleeding. Family history is negative for CRC. His uncle had stomach cancer and not colon cancer. This is patient's first colonoscopy.  Past Medical History  Diagnosis Date  . Hypertension   . Kidney disease     kidney stones  . Lupus [facility])     Past Surgical History  Procedure Laterality Date  . Gallbladder surgery    . Cholecystectomy    . Lithotripsy    . Kidney stone removal      Family History  Problem Relation Age of Onset  . Lung cancer Father   . Breast cancer Paternal Aunt    Social History:  reports that he has never smoked. He does not have any smokeless tobacco history on file. He reports that he does not drink alcohol or use illicit drugs.  Allergies: No Known Allergies  Medications Prior to Admission  Medication Sig Dispense Refill  . hydroxychloroquine (PLAQUENIL) 200 MG tablet Take 1 tablet by mouth 2 (two) times daily.    [name] BOWEL PREP SOLN Take 1 kit by mouth once. 1 Bottle 0  . valsartan-hydrochlorothiazide (DIOVAN HCT) 160-12.5 MG per tablet Take 1 tablet by mouth daily. 30 tablet 11    No results found for this or any previous visit (from the past 48 hour(s)). No results found.  ROS  Blood pressure 147/72, pulse 72, temperature 97.7 F (36.5 C), temperature source Oral, resp. rate 15, height 5' 7"  (1.702 m), weight 210 lb (95.255 kg), SpO2 96 %. Physical Exam  Constitutional: He appears well-developed and well-nourished.  HENT:  Mouth/Throat: Oropharynx is clear and moist.  Eyes: Conjunctivae are normal. No scleral icterus.  Neck: No thyromegaly present.  Cardiovascular: Normal rate, regular rhythm and normal heart sounds.   No murmur heard. Respiratory: Effort normal and breath sounds normal.  GI: Soft. He  exhibits no distension and no mass. There is no tenderness.  Musculoskeletal: He exhibits no edema.  Lymphadenopathy:    He has no cervical adenopathy.  Neurological: He is alert.  Skin: Skin is warm and dry.     Assessment/Plan Average risk screening colonoscopy.  [name] U 02/23/2015, 8:57 AM

## 2015-02-24 ENCOUNTER — Encounter (HOSPITAL_COMMUNITY): Payer: Self-pay | Admitting: Internal Medicine

## 2016-02-14 ENCOUNTER — Other Ambulatory Visit: Payer: Self-pay | Admitting: Internal Medicine

## 2016-02-14 DIAGNOSIS — I10 Essential (primary) hypertension: Secondary | ICD-10-CM

## 2016-09-26 DIAGNOSIS — Z299 Encounter for prophylactic measures, unspecified: Secondary | ICD-10-CM | POA: Diagnosis not present

## 2016-09-26 DIAGNOSIS — M109 Gout, unspecified: Secondary | ICD-10-CM | POA: Diagnosis not present

## 2016-09-26 DIAGNOSIS — Z789 Other specified health status: Secondary | ICD-10-CM | POA: Diagnosis not present

## 2016-09-26 DIAGNOSIS — E7800 Pure hypercholesterolemia, unspecified: Secondary | ICD-10-CM | POA: Diagnosis not present

## 2016-09-26 DIAGNOSIS — Z6835 Body mass index (BMI) 35.0-35.9, adult: Secondary | ICD-10-CM | POA: Diagnosis not present

## 2016-09-26 DIAGNOSIS — L930 Discoid lupus erythematosus: Secondary | ICD-10-CM | POA: Diagnosis not present

## 2016-09-26 DIAGNOSIS — I10 Essential (primary) hypertension: Secondary | ICD-10-CM | POA: Diagnosis not present

## 2016-10-29 DIAGNOSIS — H04123 Dry eye syndrome of bilateral lacrimal glands: Secondary | ICD-10-CM | POA: Diagnosis not present

## 2016-10-29 DIAGNOSIS — Z961 Presence of intraocular lens: Secondary | ICD-10-CM | POA: Diagnosis not present

## 2016-10-29 DIAGNOSIS — H349 Unspecified retinal vascular occlusion: Secondary | ICD-10-CM | POA: Diagnosis not present

## 2016-11-02 DIAGNOSIS — Z299 Encounter for prophylactic measures, unspecified: Secondary | ICD-10-CM | POA: Diagnosis not present

## 2016-11-02 DIAGNOSIS — E7800 Pure hypercholesterolemia, unspecified: Secondary | ICD-10-CM | POA: Diagnosis not present

## 2016-11-02 DIAGNOSIS — Z6835 Body mass index (BMI) 35.0-35.9, adult: Secondary | ICD-10-CM | POA: Diagnosis not present

## 2016-11-02 DIAGNOSIS — Z713 Dietary counseling and surveillance: Secondary | ICD-10-CM | POA: Diagnosis not present

## 2016-11-02 DIAGNOSIS — I10 Essential (primary) hypertension: Secondary | ICD-10-CM | POA: Diagnosis not present

## 2017-03-21 DIAGNOSIS — E669 Obesity, unspecified: Secondary | ICD-10-CM | POA: Diagnosis not present

## 2017-03-21 DIAGNOSIS — R69 Illness, unspecified: Secondary | ICD-10-CM | POA: Diagnosis not present

## 2017-03-21 DIAGNOSIS — Z299 Encounter for prophylactic measures, unspecified: Secondary | ICD-10-CM | POA: Diagnosis not present

## 2017-03-21 DIAGNOSIS — Z713 Dietary counseling and surveillance: Secondary | ICD-10-CM | POA: Diagnosis not present

## 2017-03-21 DIAGNOSIS — M109 Gout, unspecified: Secondary | ICD-10-CM | POA: Diagnosis not present

## 2017-03-21 DIAGNOSIS — E7800 Pure hypercholesterolemia, unspecified: Secondary | ICD-10-CM | POA: Diagnosis not present

## 2017-09-23 ENCOUNTER — Telehealth: Payer: Self-pay | Admitting: Orthopedic Surgery

## 2017-09-23 NOTE — Telephone Encounter (Signed)
Patient's wife called to request appointment for heel problem; offered first available appointment, and said will check around and call back if decides to schedule.

## 2017-11-27 DIAGNOSIS — Z6834 Body mass index (BMI) 34.0-34.9, adult: Secondary | ICD-10-CM | POA: Diagnosis not present

## 2017-11-27 DIAGNOSIS — I10 Essential (primary) hypertension: Secondary | ICD-10-CM | POA: Diagnosis not present

## 2017-11-27 DIAGNOSIS — Z299 Encounter for prophylactic measures, unspecified: Secondary | ICD-10-CM | POA: Diagnosis not present

## 2017-11-27 DIAGNOSIS — M7918 Myalgia, other site: Secondary | ICD-10-CM | POA: Diagnosis not present

## 2017-11-27 DIAGNOSIS — Z789 Other specified health status: Secondary | ICD-10-CM | POA: Diagnosis not present

## 2017-12-02 DIAGNOSIS — M47816 Spondylosis without myelopathy or radiculopathy, lumbar region: Secondary | ICD-10-CM | POA: Diagnosis not present

## 2017-12-02 DIAGNOSIS — M545 Low back pain: Secondary | ICD-10-CM | POA: Diagnosis not present

## 2017-12-02 DIAGNOSIS — M47817 Spondylosis without myelopathy or radiculopathy, lumbosacral region: Secondary | ICD-10-CM | POA: Diagnosis not present

## 2017-12-02 DIAGNOSIS — I700 Atherosclerosis of aorta: Secondary | ICD-10-CM | POA: Diagnosis not present

## 2017-12-02 DIAGNOSIS — N200 Calculus of kidney: Secondary | ICD-10-CM | POA: Diagnosis not present

## 2017-12-02 DIAGNOSIS — M5136 Other intervertebral disc degeneration, lumbar region: Secondary | ICD-10-CM | POA: Diagnosis not present

## 2017-12-20 DIAGNOSIS — Z6834 Body mass index (BMI) 34.0-34.9, adult: Secondary | ICD-10-CM | POA: Diagnosis not present

## 2017-12-20 DIAGNOSIS — Z713 Dietary counseling and surveillance: Secondary | ICD-10-CM | POA: Diagnosis not present

## 2017-12-20 DIAGNOSIS — L930 Discoid lupus erythematosus: Secondary | ICD-10-CM | POA: Diagnosis not present

## 2017-12-20 DIAGNOSIS — Z299 Encounter for prophylactic measures, unspecified: Secondary | ICD-10-CM | POA: Diagnosis not present

## 2017-12-20 DIAGNOSIS — I10 Essential (primary) hypertension: Secondary | ICD-10-CM | POA: Diagnosis not present

## 2017-12-27 DIAGNOSIS — Z6834 Body mass index (BMI) 34.0-34.9, adult: Secondary | ICD-10-CM | POA: Diagnosis not present

## 2017-12-27 DIAGNOSIS — E7800 Pure hypercholesterolemia, unspecified: Secondary | ICD-10-CM | POA: Diagnosis not present

## 2017-12-27 DIAGNOSIS — Z299 Encounter for prophylactic measures, unspecified: Secondary | ICD-10-CM | POA: Diagnosis not present

## 2017-12-27 DIAGNOSIS — I10 Essential (primary) hypertension: Secondary | ICD-10-CM | POA: Diagnosis not present

## 2017-12-27 DIAGNOSIS — G479 Sleep disorder, unspecified: Secondary | ICD-10-CM | POA: Diagnosis not present

## 2018-01-17 DIAGNOSIS — I10 Essential (primary) hypertension: Secondary | ICD-10-CM | POA: Diagnosis not present

## 2018-01-17 DIAGNOSIS — Z299 Encounter for prophylactic measures, unspecified: Secondary | ICD-10-CM | POA: Diagnosis not present

## 2018-01-17 DIAGNOSIS — Z713 Dietary counseling and surveillance: Secondary | ICD-10-CM | POA: Diagnosis not present

## 2018-01-17 DIAGNOSIS — Z6833 Body mass index (BMI) 33.0-33.9, adult: Secondary | ICD-10-CM | POA: Diagnosis not present

## 2018-04-07 DIAGNOSIS — R69 Illness, unspecified: Secondary | ICD-10-CM | POA: Diagnosis not present

## 2018-04-18 DIAGNOSIS — Z299 Encounter for prophylactic measures, unspecified: Secondary | ICD-10-CM | POA: Diagnosis not present

## 2018-04-18 DIAGNOSIS — Z6834 Body mass index (BMI) 34.0-34.9, adult: Secondary | ICD-10-CM | POA: Diagnosis not present

## 2018-04-18 DIAGNOSIS — I10 Essential (primary) hypertension: Secondary | ICD-10-CM | POA: Diagnosis not present

## 2018-04-18 DIAGNOSIS — M109 Gout, unspecified: Secondary | ICD-10-CM | POA: Diagnosis not present

## 2018-04-28 DIAGNOSIS — Z6834 Body mass index (BMI) 34.0-34.9, adult: Secondary | ICD-10-CM | POA: Diagnosis not present

## 2018-04-28 DIAGNOSIS — Z299 Encounter for prophylactic measures, unspecified: Secondary | ICD-10-CM | POA: Diagnosis not present

## 2018-04-28 DIAGNOSIS — E7800 Pure hypercholesterolemia, unspecified: Secondary | ICD-10-CM | POA: Diagnosis not present

## 2018-04-28 DIAGNOSIS — I10 Essential (primary) hypertension: Secondary | ICD-10-CM | POA: Diagnosis not present

## 2018-05-20 DIAGNOSIS — S39012A Strain of muscle, fascia and tendon of lower back, initial encounter: Secondary | ICD-10-CM | POA: Diagnosis not present

## 2018-05-20 DIAGNOSIS — I10 Essential (primary) hypertension: Secondary | ICD-10-CM | POA: Diagnosis not present

## 2018-05-20 DIAGNOSIS — Z299 Encounter for prophylactic measures, unspecified: Secondary | ICD-10-CM | POA: Diagnosis not present

## 2018-05-20 DIAGNOSIS — Z6834 Body mass index (BMI) 34.0-34.9, adult: Secondary | ICD-10-CM | POA: Diagnosis not present

## 2018-07-29 DIAGNOSIS — H349 Unspecified retinal vascular occlusion: Secondary | ICD-10-CM | POA: Diagnosis not present

## 2018-07-29 DIAGNOSIS — H524 Presbyopia: Secondary | ICD-10-CM | POA: Diagnosis not present

## 2018-10-08 DIAGNOSIS — B3742 Candidal balanitis: Secondary | ICD-10-CM | POA: Diagnosis not present

## 2018-10-08 DIAGNOSIS — I10 Essential (primary) hypertension: Secondary | ICD-10-CM | POA: Diagnosis not present

## 2018-10-08 DIAGNOSIS — Z6834 Body mass index (BMI) 34.0-34.9, adult: Secondary | ICD-10-CM | POA: Diagnosis not present

## 2018-10-08 DIAGNOSIS — Z789 Other specified health status: Secondary | ICD-10-CM | POA: Diagnosis not present

## 2018-10-08 DIAGNOSIS — Z299 Encounter for prophylactic measures, unspecified: Secondary | ICD-10-CM | POA: Diagnosis not present

## 2018-10-14 DIAGNOSIS — Z125 Encounter for screening for malignant neoplasm of prostate: Secondary | ICD-10-CM | POA: Diagnosis not present

## 2018-10-14 DIAGNOSIS — Z299 Encounter for prophylactic measures, unspecified: Secondary | ICD-10-CM | POA: Diagnosis not present

## 2018-10-14 DIAGNOSIS — B3742 Candidal balanitis: Secondary | ICD-10-CM | POA: Diagnosis not present

## 2018-10-14 DIAGNOSIS — Z713 Dietary counseling and surveillance: Secondary | ICD-10-CM | POA: Diagnosis not present

## 2018-10-14 DIAGNOSIS — R5383 Other fatigue: Secondary | ICD-10-CM | POA: Diagnosis not present

## 2018-10-14 DIAGNOSIS — Z6833 Body mass index (BMI) 33.0-33.9, adult: Secondary | ICD-10-CM | POA: Diagnosis not present

## 2018-10-14 DIAGNOSIS — I10 Essential (primary) hypertension: Secondary | ICD-10-CM | POA: Diagnosis not present

## 2018-11-04 DIAGNOSIS — Z1331 Encounter for screening for depression: Secondary | ICD-10-CM | POA: Diagnosis not present

## 2018-11-04 DIAGNOSIS — Z1339 Encounter for screening examination for other mental health and behavioral disorders: Secondary | ICD-10-CM | POA: Diagnosis not present

## 2018-11-04 DIAGNOSIS — G479 Sleep disorder, unspecified: Secondary | ICD-10-CM | POA: Diagnosis not present

## 2018-11-04 DIAGNOSIS — I10 Essential (primary) hypertension: Secondary | ICD-10-CM | POA: Diagnosis not present

## 2018-11-04 DIAGNOSIS — Z7189 Other specified counseling: Secondary | ICD-10-CM | POA: Diagnosis not present

## 2018-11-04 DIAGNOSIS — Z1211 Encounter for screening for malignant neoplasm of colon: Secondary | ICD-10-CM | POA: Diagnosis not present

## 2018-11-04 DIAGNOSIS — E7800 Pure hypercholesterolemia, unspecified: Secondary | ICD-10-CM | POA: Diagnosis not present

## 2018-11-04 DIAGNOSIS — Z6833 Body mass index (BMI) 33.0-33.9, adult: Secondary | ICD-10-CM | POA: Diagnosis not present

## 2018-11-04 DIAGNOSIS — Z79899 Other long term (current) drug therapy: Secondary | ICD-10-CM | POA: Diagnosis not present

## 2018-11-04 DIAGNOSIS — Z0000 Encounter for general adult medical examination without abnormal findings: Secondary | ICD-10-CM | POA: Diagnosis not present

## 2018-11-04 DIAGNOSIS — R5383 Other fatigue: Secondary | ICD-10-CM | POA: Diagnosis not present

## 2018-11-12 DIAGNOSIS — Z789 Other specified health status: Secondary | ICD-10-CM | POA: Diagnosis not present

## 2018-11-12 DIAGNOSIS — R69 Illness, unspecified: Secondary | ICD-10-CM | POA: Diagnosis not present

## 2018-11-12 DIAGNOSIS — Z6832 Body mass index (BMI) 32.0-32.9, adult: Secondary | ICD-10-CM | POA: Diagnosis not present

## 2018-11-12 DIAGNOSIS — Z299 Encounter for prophylactic measures, unspecified: Secondary | ICD-10-CM | POA: Diagnosis not present

## 2018-11-12 DIAGNOSIS — I10 Essential (primary) hypertension: Secondary | ICD-10-CM | POA: Diagnosis not present

## 2018-11-12 DIAGNOSIS — E1165 Type 2 diabetes mellitus with hyperglycemia: Secondary | ICD-10-CM | POA: Diagnosis not present

## 2018-11-13 DIAGNOSIS — R69 Illness, unspecified: Secondary | ICD-10-CM | POA: Diagnosis not present

## 2018-11-26 DIAGNOSIS — I10 Essential (primary) hypertension: Secondary | ICD-10-CM | POA: Diagnosis not present

## 2018-11-26 DIAGNOSIS — R69 Illness, unspecified: Secondary | ICD-10-CM | POA: Diagnosis not present

## 2018-11-26 DIAGNOSIS — E1165 Type 2 diabetes mellitus with hyperglycemia: Secondary | ICD-10-CM | POA: Diagnosis not present

## 2018-11-26 DIAGNOSIS — Z6832 Body mass index (BMI) 32.0-32.9, adult: Secondary | ICD-10-CM | POA: Diagnosis not present

## 2018-11-26 DIAGNOSIS — Z299 Encounter for prophylactic measures, unspecified: Secondary | ICD-10-CM | POA: Diagnosis not present

## 2018-12-04 DIAGNOSIS — R69 Illness, unspecified: Secondary | ICD-10-CM | POA: Diagnosis not present

## 2018-12-11 DIAGNOSIS — R69 Illness, unspecified: Secondary | ICD-10-CM | POA: Diagnosis not present

## 2019-01-09 DIAGNOSIS — R69 Illness, unspecified: Secondary | ICD-10-CM | POA: Diagnosis not present

## 2019-02-13 DIAGNOSIS — Z299 Encounter for prophylactic measures, unspecified: Secondary | ICD-10-CM | POA: Diagnosis not present

## 2019-02-13 DIAGNOSIS — I10 Essential (primary) hypertension: Secondary | ICD-10-CM | POA: Diagnosis not present

## 2019-02-13 DIAGNOSIS — E1165 Type 2 diabetes mellitus with hyperglycemia: Secondary | ICD-10-CM | POA: Diagnosis not present

## 2019-02-13 DIAGNOSIS — Z6832 Body mass index (BMI) 32.0-32.9, adult: Secondary | ICD-10-CM | POA: Diagnosis not present

## 2019-02-13 DIAGNOSIS — Z23 Encounter for immunization: Secondary | ICD-10-CM | POA: Diagnosis not present

## 2019-02-13 DIAGNOSIS — M545 Low back pain: Secondary | ICD-10-CM | POA: Diagnosis not present

## 2019-02-27 DIAGNOSIS — I10 Essential (primary) hypertension: Secondary | ICD-10-CM | POA: Diagnosis not present

## 2019-02-27 DIAGNOSIS — E1165 Type 2 diabetes mellitus with hyperglycemia: Secondary | ICD-10-CM | POA: Diagnosis not present

## 2019-02-27 DIAGNOSIS — E119 Type 2 diabetes mellitus without complications: Secondary | ICD-10-CM | POA: Diagnosis not present

## 2019-02-27 DIAGNOSIS — Z6832 Body mass index (BMI) 32.0-32.9, adult: Secondary | ICD-10-CM | POA: Diagnosis not present

## 2019-02-27 DIAGNOSIS — G479 Sleep disorder, unspecified: Secondary | ICD-10-CM | POA: Diagnosis not present

## 2019-02-27 DIAGNOSIS — R69 Illness, unspecified: Secondary | ICD-10-CM | POA: Diagnosis not present

## 2019-02-27 DIAGNOSIS — Z299 Encounter for prophylactic measures, unspecified: Secondary | ICD-10-CM | POA: Diagnosis not present

## 2019-05-02 DIAGNOSIS — R69 Illness, unspecified: Secondary | ICD-10-CM | POA: Diagnosis not present

## 2019-05-18 DIAGNOSIS — Z299 Encounter for prophylactic measures, unspecified: Secondary | ICD-10-CM | POA: Diagnosis not present

## 2019-05-18 DIAGNOSIS — Z6832 Body mass index (BMI) 32.0-32.9, adult: Secondary | ICD-10-CM | POA: Diagnosis not present

## 2019-05-18 DIAGNOSIS — R490 Dysphonia: Secondary | ICD-10-CM | POA: Diagnosis not present

## 2019-05-18 DIAGNOSIS — I10 Essential (primary) hypertension: Secondary | ICD-10-CM | POA: Diagnosis not present

## 2019-05-18 DIAGNOSIS — E1165 Type 2 diabetes mellitus with hyperglycemia: Secondary | ICD-10-CM | POA: Diagnosis not present

## 2019-05-29 DIAGNOSIS — R69 Illness, unspecified: Secondary | ICD-10-CM | POA: Diagnosis not present

## 2019-06-28 DIAGNOSIS — R69 Illness, unspecified: Secondary | ICD-10-CM | POA: Diagnosis not present

## 2019-07-22 DIAGNOSIS — R69 Illness, unspecified: Secondary | ICD-10-CM | POA: Diagnosis not present

## 2019-08-19 DIAGNOSIS — R69 Illness, unspecified: Secondary | ICD-10-CM | POA: Diagnosis not present

## 2019-08-20 DIAGNOSIS — Z789 Other specified health status: Secondary | ICD-10-CM | POA: Diagnosis not present

## 2019-08-20 DIAGNOSIS — I10 Essential (primary) hypertension: Secondary | ICD-10-CM | POA: Diagnosis not present

## 2019-08-20 DIAGNOSIS — Z6833 Body mass index (BMI) 33.0-33.9, adult: Secondary | ICD-10-CM | POA: Diagnosis not present

## 2019-08-20 DIAGNOSIS — R499 Unspecified voice and resonance disorder: Secondary | ICD-10-CM | POA: Diagnosis not present

## 2019-08-20 DIAGNOSIS — Z299 Encounter for prophylactic measures, unspecified: Secondary | ICD-10-CM | POA: Diagnosis not present

## 2019-08-20 DIAGNOSIS — E1165 Type 2 diabetes mellitus with hyperglycemia: Secondary | ICD-10-CM | POA: Diagnosis not present

## 2019-09-17 DIAGNOSIS — R69 Illness, unspecified: Secondary | ICD-10-CM | POA: Diagnosis not present

## 2019-10-15 DIAGNOSIS — R69 Illness, unspecified: Secondary | ICD-10-CM | POA: Diagnosis not present

## 2019-11-05 DIAGNOSIS — R5383 Other fatigue: Secondary | ICD-10-CM | POA: Diagnosis not present

## 2019-11-05 DIAGNOSIS — Z6833 Body mass index (BMI) 33.0-33.9, adult: Secondary | ICD-10-CM | POA: Diagnosis not present

## 2019-11-05 DIAGNOSIS — Z79899 Other long term (current) drug therapy: Secondary | ICD-10-CM | POA: Diagnosis not present

## 2019-11-05 DIAGNOSIS — E7800 Pure hypercholesterolemia, unspecified: Secondary | ICD-10-CM | POA: Diagnosis not present

## 2019-11-05 DIAGNOSIS — Z1339 Encounter for screening examination for other mental health and behavioral disorders: Secondary | ICD-10-CM | POA: Diagnosis not present

## 2019-11-05 DIAGNOSIS — Z299 Encounter for prophylactic measures, unspecified: Secondary | ICD-10-CM | POA: Diagnosis not present

## 2019-11-05 DIAGNOSIS — Z1211 Encounter for screening for malignant neoplasm of colon: Secondary | ICD-10-CM | POA: Diagnosis not present

## 2019-11-05 DIAGNOSIS — I10 Essential (primary) hypertension: Secondary | ICD-10-CM | POA: Diagnosis not present

## 2019-11-05 DIAGNOSIS — Z0000 Encounter for general adult medical examination without abnormal findings: Secondary | ICD-10-CM | POA: Diagnosis not present

## 2019-11-05 DIAGNOSIS — Z125 Encounter for screening for malignant neoplasm of prostate: Secondary | ICD-10-CM | POA: Diagnosis not present

## 2019-11-05 DIAGNOSIS — Z7189 Other specified counseling: Secondary | ICD-10-CM | POA: Diagnosis not present

## 2019-11-05 DIAGNOSIS — Z1331 Encounter for screening for depression: Secondary | ICD-10-CM | POA: Diagnosis not present

## 2019-11-14 DIAGNOSIS — R69 Illness, unspecified: Secondary | ICD-10-CM | POA: Diagnosis not present

## 2020-01-18 DIAGNOSIS — Z299 Encounter for prophylactic measures, unspecified: Secondary | ICD-10-CM | POA: Diagnosis not present

## 2020-01-18 DIAGNOSIS — Z6833 Body mass index (BMI) 33.0-33.9, adult: Secondary | ICD-10-CM | POA: Diagnosis not present

## 2020-01-18 DIAGNOSIS — I10 Essential (primary) hypertension: Secondary | ICD-10-CM | POA: Diagnosis not present

## 2020-01-18 DIAGNOSIS — M549 Dorsalgia, unspecified: Secondary | ICD-10-CM | POA: Diagnosis not present

## 2020-01-18 DIAGNOSIS — I700 Atherosclerosis of aorta: Secondary | ICD-10-CM | POA: Diagnosis not present

## 2020-01-18 DIAGNOSIS — N200 Calculus of kidney: Secondary | ICD-10-CM | POA: Diagnosis not present

## 2020-01-18 DIAGNOSIS — E1165 Type 2 diabetes mellitus with hyperglycemia: Secondary | ICD-10-CM | POA: Diagnosis not present

## 2020-01-19 DIAGNOSIS — N200 Calculus of kidney: Secondary | ICD-10-CM | POA: Diagnosis not present

## 2020-01-19 DIAGNOSIS — I2510 Atherosclerotic heart disease of native coronary artery without angina pectoris: Secondary | ICD-10-CM | POA: Diagnosis not present

## 2020-01-19 DIAGNOSIS — K8689 Other specified diseases of pancreas: Secondary | ICD-10-CM | POA: Diagnosis not present

## 2020-01-19 DIAGNOSIS — K760 Fatty (change of) liver, not elsewhere classified: Secondary | ICD-10-CM | POA: Diagnosis not present

## 2020-01-19 DIAGNOSIS — I700 Atherosclerosis of aorta: Secondary | ICD-10-CM | POA: Diagnosis not present

## 2020-01-19 DIAGNOSIS — N202 Calculus of kidney with calculus of ureter: Secondary | ICD-10-CM | POA: Diagnosis not present

## 2020-01-27 DIAGNOSIS — R69 Illness, unspecified: Secondary | ICD-10-CM | POA: Diagnosis not present

## 2020-02-04 DIAGNOSIS — I10 Essential (primary) hypertension: Secondary | ICD-10-CM | POA: Diagnosis not present

## 2020-02-04 DIAGNOSIS — I700 Atherosclerosis of aorta: Secondary | ICD-10-CM | POA: Diagnosis not present

## 2020-02-04 DIAGNOSIS — Z6833 Body mass index (BMI) 33.0-33.9, adult: Secondary | ICD-10-CM | POA: Diagnosis not present

## 2020-02-04 DIAGNOSIS — Z299 Encounter for prophylactic measures, unspecified: Secondary | ICD-10-CM | POA: Diagnosis not present

## 2020-02-04 DIAGNOSIS — E1165 Type 2 diabetes mellitus with hyperglycemia: Secondary | ICD-10-CM | POA: Diagnosis not present

## 2020-02-10 DIAGNOSIS — R69 Illness, unspecified: Secondary | ICD-10-CM | POA: Diagnosis not present

## 2020-02-11 DIAGNOSIS — H349 Unspecified retinal vascular occlusion: Secondary | ICD-10-CM | POA: Diagnosis not present

## 2020-02-11 DIAGNOSIS — Z961 Presence of intraocular lens: Secondary | ICD-10-CM | POA: Diagnosis not present

## 2020-02-11 DIAGNOSIS — H5203 Hypermetropia, bilateral: Secondary | ICD-10-CM | POA: Diagnosis not present

## 2020-02-18 ENCOUNTER — Encounter (INDEPENDENT_AMBULATORY_CARE_PROVIDER_SITE_OTHER): Payer: Self-pay | Admitting: *Deleted

## 2020-02-22 DIAGNOSIS — N202 Calculus of kidney with calculus of ureter: Secondary | ICD-10-CM | POA: Diagnosis not present

## 2020-02-22 DIAGNOSIS — R1084 Generalized abdominal pain: Secondary | ICD-10-CM | POA: Diagnosis not present

## 2020-02-29 DIAGNOSIS — I6529 Occlusion and stenosis of unspecified carotid artery: Secondary | ICD-10-CM | POA: Diagnosis not present

## 2020-02-29 DIAGNOSIS — I6523 Occlusion and stenosis of bilateral carotid arteries: Secondary | ICD-10-CM | POA: Diagnosis not present

## 2020-03-07 DIAGNOSIS — R69 Illness, unspecified: Secondary | ICD-10-CM | POA: Diagnosis not present

## 2020-03-16 DIAGNOSIS — R69 Illness, unspecified: Secondary | ICD-10-CM | POA: Diagnosis not present

## 2020-04-21 DIAGNOSIS — I10 Essential (primary) hypertension: Secondary | ICD-10-CM | POA: Diagnosis not present

## 2020-04-21 DIAGNOSIS — I779 Disorder of arteries and arterioles, unspecified: Secondary | ICD-10-CM | POA: Diagnosis not present

## 2020-04-21 DIAGNOSIS — E1165 Type 2 diabetes mellitus with hyperglycemia: Secondary | ICD-10-CM | POA: Diagnosis not present

## 2020-04-21 DIAGNOSIS — Z6833 Body mass index (BMI) 33.0-33.9, adult: Secondary | ICD-10-CM | POA: Diagnosis not present

## 2020-04-21 DIAGNOSIS — Z299 Encounter for prophylactic measures, unspecified: Secondary | ICD-10-CM | POA: Diagnosis not present

## 2020-04-21 DIAGNOSIS — B3742 Candidal balanitis: Secondary | ICD-10-CM | POA: Diagnosis not present

## 2020-05-20 DIAGNOSIS — I779 Disorder of arteries and arterioles, unspecified: Secondary | ICD-10-CM | POA: Diagnosis not present

## 2020-05-20 DIAGNOSIS — E1165 Type 2 diabetes mellitus with hyperglycemia: Secondary | ICD-10-CM | POA: Diagnosis not present

## 2020-05-20 DIAGNOSIS — I10 Essential (primary) hypertension: Secondary | ICD-10-CM | POA: Diagnosis not present

## 2020-07-22 DIAGNOSIS — Z6833 Body mass index (BMI) 33.0-33.9, adult: Secondary | ICD-10-CM | POA: Diagnosis not present

## 2020-07-22 DIAGNOSIS — Z299 Encounter for prophylactic measures, unspecified: Secondary | ICD-10-CM | POA: Diagnosis not present

## 2020-07-22 DIAGNOSIS — N200 Calculus of kidney: Secondary | ICD-10-CM | POA: Diagnosis not present

## 2020-07-22 DIAGNOSIS — I10 Essential (primary) hypertension: Secondary | ICD-10-CM | POA: Diagnosis not present

## 2020-07-22 DIAGNOSIS — Z789 Other specified health status: Secondary | ICD-10-CM | POA: Diagnosis not present

## 2020-07-22 DIAGNOSIS — E1165 Type 2 diabetes mellitus with hyperglycemia: Secondary | ICD-10-CM | POA: Diagnosis not present

## 2020-07-22 DIAGNOSIS — I779 Disorder of arteries and arterioles, unspecified: Secondary | ICD-10-CM | POA: Diagnosis not present

## 2020-07-22 DIAGNOSIS — R030 Elevated blood-pressure reading, without diagnosis of hypertension: Secondary | ICD-10-CM | POA: Diagnosis not present

## 2020-09-13 DIAGNOSIS — R69 Illness, unspecified: Secondary | ICD-10-CM | POA: Diagnosis not present

## 2020-11-08 DIAGNOSIS — Z0000 Encounter for general adult medical examination without abnormal findings: Secondary | ICD-10-CM | POA: Diagnosis not present

## 2020-11-08 DIAGNOSIS — Z125 Encounter for screening for malignant neoplasm of prostate: Secondary | ICD-10-CM | POA: Diagnosis not present

## 2020-11-08 DIAGNOSIS — Z1331 Encounter for screening for depression: Secondary | ICD-10-CM | POA: Diagnosis not present

## 2020-11-08 DIAGNOSIS — I10 Essential (primary) hypertension: Secondary | ICD-10-CM | POA: Diagnosis not present

## 2020-11-08 DIAGNOSIS — E7800 Pure hypercholesterolemia, unspecified: Secondary | ICD-10-CM | POA: Diagnosis not present

## 2020-11-08 DIAGNOSIS — R5383 Other fatigue: Secondary | ICD-10-CM | POA: Diagnosis not present

## 2020-11-08 DIAGNOSIS — E1165 Type 2 diabetes mellitus with hyperglycemia: Secondary | ICD-10-CM | POA: Diagnosis not present

## 2020-11-08 DIAGNOSIS — Z1339 Encounter for screening examination for other mental health and behavioral disorders: Secondary | ICD-10-CM | POA: Diagnosis not present

## 2020-11-08 DIAGNOSIS — Z6832 Body mass index (BMI) 32.0-32.9, adult: Secondary | ICD-10-CM | POA: Diagnosis not present

## 2020-11-08 DIAGNOSIS — B3742 Candidal balanitis: Secondary | ICD-10-CM | POA: Diagnosis not present

## 2020-11-08 DIAGNOSIS — Z79899 Other long term (current) drug therapy: Secondary | ICD-10-CM | POA: Diagnosis not present

## 2020-11-08 DIAGNOSIS — Z7189 Other specified counseling: Secondary | ICD-10-CM | POA: Diagnosis not present

## 2021-01-18 DIAGNOSIS — I10 Essential (primary) hypertension: Secondary | ICD-10-CM | POA: Diagnosis not present

## 2021-01-18 DIAGNOSIS — I779 Disorder of arteries and arterioles, unspecified: Secondary | ICD-10-CM | POA: Diagnosis not present

## 2021-01-18 DIAGNOSIS — E1165 Type 2 diabetes mellitus with hyperglycemia: Secondary | ICD-10-CM | POA: Diagnosis not present

## 2021-01-26 DIAGNOSIS — H35371 Puckering of macula, right eye: Secondary | ICD-10-CM | POA: Diagnosis not present

## 2021-02-09 DIAGNOSIS — E119 Type 2 diabetes mellitus without complications: Secondary | ICD-10-CM | POA: Diagnosis not present

## 2021-02-09 DIAGNOSIS — H349 Unspecified retinal vascular occlusion: Secondary | ICD-10-CM | POA: Diagnosis not present

## 2021-02-09 DIAGNOSIS — H52201 Unspecified astigmatism, right eye: Secondary | ICD-10-CM | POA: Diagnosis not present

## 2021-02-09 DIAGNOSIS — H35371 Puckering of macula, right eye: Secondary | ICD-10-CM | POA: Diagnosis not present

## 2021-02-15 DIAGNOSIS — M549 Dorsalgia, unspecified: Secondary | ICD-10-CM | POA: Diagnosis not present

## 2021-02-15 DIAGNOSIS — Z299 Encounter for prophylactic measures, unspecified: Secondary | ICD-10-CM | POA: Diagnosis not present

## 2021-02-15 DIAGNOSIS — I10 Essential (primary) hypertension: Secondary | ICD-10-CM | POA: Diagnosis not present

## 2021-02-15 DIAGNOSIS — Z6832 Body mass index (BMI) 32.0-32.9, adult: Secondary | ICD-10-CM | POA: Diagnosis not present

## 2021-02-15 DIAGNOSIS — E1165 Type 2 diabetes mellitus with hyperglycemia: Secondary | ICD-10-CM | POA: Diagnosis not present

## 2021-02-16 DIAGNOSIS — M25551 Pain in right hip: Secondary | ICD-10-CM | POA: Diagnosis not present

## 2021-02-16 DIAGNOSIS — M25552 Pain in left hip: Secondary | ICD-10-CM | POA: Diagnosis not present

## 2021-02-16 DIAGNOSIS — M545 Low back pain, unspecified: Secondary | ICD-10-CM | POA: Diagnosis not present

## 2021-02-17 DIAGNOSIS — I10 Essential (primary) hypertension: Secondary | ICD-10-CM | POA: Diagnosis not present

## 2021-02-17 DIAGNOSIS — R490 Dysphonia: Secondary | ICD-10-CM | POA: Diagnosis not present

## 2021-02-22 DIAGNOSIS — I10 Essential (primary) hypertension: Secondary | ICD-10-CM | POA: Diagnosis not present

## 2021-02-22 DIAGNOSIS — Z299 Encounter for prophylactic measures, unspecified: Secondary | ICD-10-CM | POA: Diagnosis not present

## 2021-02-22 DIAGNOSIS — E1165 Type 2 diabetes mellitus with hyperglycemia: Secondary | ICD-10-CM | POA: Diagnosis not present

## 2021-02-22 DIAGNOSIS — Z6832 Body mass index (BMI) 32.0-32.9, adult: Secondary | ICD-10-CM | POA: Diagnosis not present

## 2021-02-22 DIAGNOSIS — M549 Dorsalgia, unspecified: Secondary | ICD-10-CM | POA: Diagnosis not present

## 2021-12-08 DIAGNOSIS — R319 Hematuria, unspecified: Secondary | ICD-10-CM | POA: Diagnosis not present

## 2021-12-08 DIAGNOSIS — I10 Essential (primary) hypertension: Secondary | ICD-10-CM | POA: Diagnosis not present

## 2021-12-08 DIAGNOSIS — M549 Dorsalgia, unspecified: Secondary | ICD-10-CM | POA: Diagnosis not present

## 2021-12-08 DIAGNOSIS — Z299 Encounter for prophylactic measures, unspecified: Secondary | ICD-10-CM | POA: Diagnosis not present

## 2021-12-18 ENCOUNTER — Encounter: Payer: Self-pay | Admitting: *Deleted

## 2022-01-26 DIAGNOSIS — H31002 Unspecified chorioretinal scars, left eye: Secondary | ICD-10-CM | POA: Diagnosis not present

## 2022-01-26 DIAGNOSIS — H35373 Puckering of macula, bilateral: Secondary | ICD-10-CM | POA: Diagnosis not present

## 2022-01-26 DIAGNOSIS — H52203 Unspecified astigmatism, bilateral: Secondary | ICD-10-CM | POA: Diagnosis not present

## 2022-01-26 DIAGNOSIS — E119 Type 2 diabetes mellitus without complications: Secondary | ICD-10-CM | POA: Diagnosis not present

## 2022-04-06 DIAGNOSIS — E669 Obesity, unspecified: Secondary | ICD-10-CM | POA: Diagnosis not present

## 2022-04-06 DIAGNOSIS — Z8249 Family history of ischemic heart disease and other diseases of the circulatory system: Secondary | ICD-10-CM | POA: Diagnosis not present

## 2022-04-06 DIAGNOSIS — M1990 Unspecified osteoarthritis, unspecified site: Secondary | ICD-10-CM | POA: Diagnosis not present

## 2022-04-06 DIAGNOSIS — Z7984 Long term (current) use of oral hypoglycemic drugs: Secondary | ICD-10-CM | POA: Diagnosis not present

## 2022-04-06 DIAGNOSIS — Z809 Family history of malignant neoplasm, unspecified: Secondary | ICD-10-CM | POA: Diagnosis not present

## 2022-04-06 DIAGNOSIS — Z008 Encounter for other general examination: Secondary | ICD-10-CM | POA: Diagnosis not present

## 2022-04-06 DIAGNOSIS — M109 Gout, unspecified: Secondary | ICD-10-CM | POA: Diagnosis not present

## 2022-04-06 DIAGNOSIS — E119 Type 2 diabetes mellitus without complications: Secondary | ICD-10-CM | POA: Diagnosis not present

## 2022-04-06 DIAGNOSIS — Z6830 Body mass index (BMI) 30.0-30.9, adult: Secondary | ICD-10-CM | POA: Diagnosis not present

## 2022-04-06 DIAGNOSIS — Z833 Family history of diabetes mellitus: Secondary | ICD-10-CM | POA: Diagnosis not present

## 2022-04-06 DIAGNOSIS — I10 Essential (primary) hypertension: Secondary | ICD-10-CM | POA: Diagnosis not present

## 2022-04-16 DIAGNOSIS — Z1339 Encounter for screening examination for other mental health and behavioral disorders: Secondary | ICD-10-CM | POA: Diagnosis not present

## 2022-04-16 DIAGNOSIS — Z0000 Encounter for general adult medical examination without abnormal findings: Secondary | ICD-10-CM | POA: Diagnosis not present

## 2022-04-16 DIAGNOSIS — I700 Atherosclerosis of aorta: Secondary | ICD-10-CM | POA: Diagnosis not present

## 2022-04-16 DIAGNOSIS — Z6831 Body mass index (BMI) 31.0-31.9, adult: Secondary | ICD-10-CM | POA: Diagnosis not present

## 2022-04-16 DIAGNOSIS — I10 Essential (primary) hypertension: Secondary | ICD-10-CM | POA: Diagnosis not present

## 2022-04-16 DIAGNOSIS — L930 Discoid lupus erythematosus: Secondary | ICD-10-CM | POA: Diagnosis not present

## 2022-04-16 DIAGNOSIS — I779 Disorder of arteries and arterioles, unspecified: Secondary | ICD-10-CM | POA: Diagnosis not present

## 2022-04-16 DIAGNOSIS — Z7189 Other specified counseling: Secondary | ICD-10-CM | POA: Diagnosis not present

## 2022-04-16 DIAGNOSIS — Z1331 Encounter for screening for depression: Secondary | ICD-10-CM | POA: Diagnosis not present

## 2022-04-16 DIAGNOSIS — Z299 Encounter for prophylactic measures, unspecified: Secondary | ICD-10-CM | POA: Diagnosis not present

## 2022-04-16 DIAGNOSIS — E1165 Type 2 diabetes mellitus with hyperglycemia: Secondary | ICD-10-CM | POA: Diagnosis not present

## 2022-07-20 DIAGNOSIS — I700 Atherosclerosis of aorta: Secondary | ICD-10-CM | POA: Diagnosis not present

## 2022-07-20 DIAGNOSIS — I779 Disorder of arteries and arterioles, unspecified: Secondary | ICD-10-CM | POA: Diagnosis not present

## 2022-07-20 DIAGNOSIS — Z299 Encounter for prophylactic measures, unspecified: Secondary | ICD-10-CM | POA: Diagnosis not present

## 2022-07-20 DIAGNOSIS — E1165 Type 2 diabetes mellitus with hyperglycemia: Secondary | ICD-10-CM | POA: Diagnosis not present

## 2022-07-20 DIAGNOSIS — I10 Essential (primary) hypertension: Secondary | ICD-10-CM | POA: Diagnosis not present

## 2022-07-20 DIAGNOSIS — R251 Tremor, unspecified: Secondary | ICD-10-CM | POA: Diagnosis not present
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
## 2022-07-26 DIAGNOSIS — I779 Disorder of arteries and arterioles, unspecified: Secondary | ICD-10-CM | POA: Diagnosis not present

## 2022-07-26 DIAGNOSIS — E1165 Type 2 diabetes mellitus with hyperglycemia: Secondary | ICD-10-CM | POA: Diagnosis not present

## 2022-07-26 DIAGNOSIS — Z299 Encounter for prophylactic measures, unspecified: Secondary | ICD-10-CM | POA: Diagnosis not present

## 2022-07-26 DIAGNOSIS — I10 Essential (primary) hypertension: Secondary | ICD-10-CM | POA: Diagnosis not present

## 2022-07-27 ENCOUNTER — Encounter: Payer: Self-pay | Admitting: Neurology

## 2022-07-27 ENCOUNTER — Ambulatory Visit: Payer: Medicare HMO | Admitting: Neurology

## 2022-07-27 VITALS — BP 154/68 | HR 60 | Ht 67.0 in | Wt 196.8 lb

## 2022-07-27 DIAGNOSIS — G4752 REM sleep behavior disorder: Secondary | ICD-10-CM | POA: Diagnosis not present

## 2022-07-27 DIAGNOSIS — G20A1 Parkinson's disease without dyskinesia, without mention of fluctuations: Secondary | ICD-10-CM | POA: Diagnosis not present

## 2022-07-27 MED ORDER — CARBIDOPA-LEVODOPA 25-100 MG PO TABS: 1.0000 | ORAL_TABLET | Freq: Three times a day (TID) | ORAL | 1 refills | Status: DC

## 2022-07-27 NOTE — Patient Instructions (Addendum)
Start Carbidopa Levodopa as follows: Take 1/2 tablet three times daily, at least 30 minutes before meals (approximately 7am/11am/4pm), for one week Then take 1/2 tablet in the morning, 1/2 tablet in the afternoon, 1 tablet in the evening, at least 30 minutes before meals, for one week Then take 1/2 tablet in the morning, 1 tablet in the afternoon, 1 tablet in the evening, at least 30 minutes before meals, for one week Then take 1 tablet three times daily at 7am/11am/4pm, at least 30 minutes before meals   As a reminder, carbidopa/levodopa can be taken at the same time as a carbohydrate, but we like to have you take your pill either 30 minutes before a protein source or 1 hour after as protein can interfere with carbidopa/levodopa absorption.  As we discussed, it used to be thought that levodopa would increase risk of melanoma but now it is believed that Parkinsons itself likely increases risk of melanoma. I recommend yearly skin checks with a board certified dermatologist.  You can call [facility] or [facility] for an appointment.  [facility] Address: [address], [place], [place] [address] Phone: [phone]  [facility] [place], [place], [place] Phone: [phone]  [place] for Power over Parkinson's Group  March 2024   [place] over Parkinson's Group:    Power Over Parkinson's Patient Education Group will be Wednesday, March 13th-*Hybrid meting*- in person at [facility] location and via [facility], 2:00-3:00 pm.   Starting in November 2023, Power over [employer] and Care Partner Groups will meet together, with plans for separate break out session for caregivers (*this will be evolving over the next few months) Upcoming Power over Parkinson's Meetings/Care Partner Support:  2nd Wednesdays of the month at 2 pm:  March 13th, April  10th [place] at [email or web address] if interested in participating in this group    LOCAL EVENTS AND NEW OFFERINGS  Let's Try Pickleball-$25 for 6 weeks of Pickleball, starting February 2nd.  Contact Corwin Levins for more details.  [email or web address] NEW:  Parkinson's Social Game Night.  First Thursday of each month, 2:00-4:00 pm.  *Next date is MARCH 7th*.  [place], [employer].  Contact [email or web address] if interested. Parkinson's CarePartner Group for Men is in the works, if interested email [email or web address] ACT FITNESS Chair Yoga classes "Train and Gain", Fridays 10 am, ACT Fitness.  Contact Gina at [phone].  PWR! Moves [employer] Instructor-Led Classes offering at [employer]!  TUESDAYS and Wednesdays 1-2 pm.   Contact Vonna Kotyk at  [email or web address]  or [phone] (Tuesday classes are modified for chair and standing only) Drumming for Parkinson's will be held on 2nd and 4th Mondays at 11:00 am.   Located at the [place] ([place]  Contact Doylene Canning at [email or web address] or [phone]  Dance for Parkinson 's classes will be on Tuesdays 10-11 am starting in February. Located in the [employer] , in the first floor of the [employer] ([place].) To register:  [email or web address] or [phone] [facility] Topaz Class, Mondays at 11 am.  Call [phone] for details Moving Day [facility].  Saturday, May 4th, 10 am start.  Register at [employer]:  [email or web address]  PD Health at Home continues:  Mindfulness Mondays, Wellness Wednesdays, Fitness Fridays  (  PWR! Moves as part of Fitness Fridays March 22nd, 1-1:45 pm) Upcoming Education:   Managing "Off" Periods:  Return of Parkinson's  Symptoms.  Wednesday, March 20th, 1-2 pm Parkinson's 101.  Wednesday, April 3rd, 1-2 pm Expert Briefing:  Understanding Pain in Parkinson's.   Wednesday, March 13th, 1-2 pm  Research Update:  Working to [employer] PD.  Wednesday, April 10th, 1-2 pm Register for virtual education and Patent attorney (webinars) at [email or web address].pl Please check out their website to sign up for emails and see their full online offerings     [employer]:  [email or web address]   Third Thursday Webinars:  On the third Thursday of every month at 12 p.m. ET, join our free live webinars to learn about various aspects of living with Parkinson's disease and our work to speed medical breakthroughs.  Upcoming Webinar:  Everyday Exposure to Parkinson's:  Environmental Connections to the Disease.  Thursday, March 21st at 12 noon. Check out additional information on their website to see their full online offerings    [facility]:  [email or web address]  Upcoming Webinar:   Nutrition and Parkinson's.  Wednesday, March 6th, 12 noon Webinar Series:  Living with Parkinson's Meetup.   Third Thursdays each month, 3 pm  Care Partner Monthly Meetup.  With Robin Searing [place].  First Tuesday of each month, 2 pm  Check out additional information to Live Well Today on their website    Parkinson and Movement Disorders (PMD) Alliance:  www.[email or web address]  NeuroLife Online:  Online Education Events  Sign up for emails, which are sent weekly to give you updates on programming and online offerings    Parkinson's Association of the [place]:  www.[email or web address]  Information on online support groups, education events, and online exercises including Yoga, Parkinson's exercises and more-LOTS of information on links to PD resources and online events  Virtual Support Group through Parkinson's Association of the [place]; next one is scheduled for Wednesday, March  6th  MOVEMENT AND EXERCISE OPPORTUNITIES  PWR! Moves Classes at [place].  Wednesdays 10 and 11 am.   Contact Amy Marriott, PT [email or web address] if interested.  PWR! Moves Class offerings at [employer]. *TUESDAYS* and Wednesdays 1-2 pm.    Contact Vonna Kotyk at  [email or web address]    Parkinson's Wellness Recovery (PWR! Moves)  www.[email or web address]  Info on the PWR! Virtual Experience:  You will have access to our expertise?through self-assessment, guided plans that start with the PD-specific fundamentals, educational content, tips, Q&A with an expert, and a growing Art therapist of PD-specific pre-recorded and live exercise classes of varying types and intensity - both physical and cognitive! If that is not enough, we offer 1:1 wellness consultations (in-person or virtual) to personalize your PWR! Research scientist (medical).   Bo[place] Fridays:   As part of the PD Health @ Home program, this free video series focuses each week on one aspect of fitness designed to support people living with Parkinson's.? These weekly videos highlight the [place] fitness guidelines for people with Parkinson's disease.  ModemGamers.si  Dance for PD website is offering free, live-stream classes throughout the week, as well as links to [employer] of classes:  [email or web address]  Virtual dance and Pilates for Parkinson's classes: Click on the Community Tab> Parkinson's Movement Initiative Tab.  To register for classes and for more information, visit [email or web address] and click the "community" tab.   [employer] Parkinson's Cycling Classes   [employer]:  Thursdays @ Noon-Live classes at [employer] ([employer] at [email or web address]?or [phone])  [employer]: Virtual Classes Mondays and Thursdays Jeanette Caprice classes Tuesday, Wednesday and Thursday (contact [place]  at [email or web address] ?or [phone])  [employer]  Varied levels of classes are offered Tuesdays and Thursdays at [employer].   Stretching with Verdis Frederickson weekly class is also offered for people with Parkinson's  To observe a class or for more information, call [phone] or email Hezzie Bump at [email or web address]   ADDITIONAL SUPPORT AND RESOURCES  Well-Spring [employer]:Online Caregiver Education Opportunities:  www.well-springsolutions.org/caregiver-education/caregiver-support-group.  You may also contact Vickki Muff at [email or web address] or [phone].     Family Caregiver [phone].  Thursday, March 7th, 10:15-1:45 at [facility].  Register with [facility] (see above) Well-Spring Navigator:  Just1Navigator program, a?free service to help individuals and families through the journey of determining care for older adults.  The "Navigator" is a Vickki Muff, Education officer, museum, who will speak with a prospective client and/or loved ones to provide an assessment of the situation and a set of recommendations for a personalized care plan -- all free of charge, and whether?Well-Spring [employer] offers the needed service or not. If the need is not a service we provide, we are well-connected with reputable programs in town that we can refer you to.  www.well-springsolutions.org or to speak with the Navigator, call [phone].

## 2022-08-15 ENCOUNTER — Telehealth: Payer: Self-pay

## 2022-08-15 NOTE — Telephone Encounter (Signed)
Wife called in stating that the medication given to him from Dr.Tat is making him feel horrible and dizzy, they are wondering what they should do.

## 2022-08-15 NOTE — Telephone Encounter (Signed)
Called patients wife back to ask if patient were taking the medication with protein and have they taken patients blood pressure to see if patients blood pressure was falling with the medication

## 2022-08-16 NOTE — Telephone Encounter (Signed)
Called and left detailed message on voicemail. Also we had wrong phone number in for patients wife it was his sons number. I have misplaced correct number and need to update the patients contacts when they call back

## 2022-08-16 NOTE — Telephone Encounter (Signed)
Patients wife returned phone call, have updated number.

## 2022-08-16 NOTE — Telephone Encounter (Signed)
Called patients wife back ° °

## 2022-08-16 NOTE — Telephone Encounter (Signed)
Spoke to patients wife concerning his BP and gave Dr. Arturo Morton recommendations she agreed and is following up with the PCP for Bp med

## 2022-09-07 DIAGNOSIS — H43811 Vitreous degeneration, right eye: Secondary | ICD-10-CM | POA: Diagnosis not present

## 2022-09-21 ENCOUNTER — Telehealth: Payer: Self-pay | Admitting: Anesthesiology

## 2022-09-21 NOTE — Telephone Encounter (Signed)
Pt's wife called stating pt has been taking carbidopa-levodopa for 6 weeks, states pt is not feeling well, not sleeping well,feels dizzy, shaky and fatigued. States medication does not seem to be helping any, can he stop medication.

## 2022-09-21 NOTE — Telephone Encounter (Signed)
Spoke to patiens wife and they will decide and let us know

## 2022-09-21 NOTE — Telephone Encounter (Signed)
Does he want to try going back to 1/2 tab TID and wait a little longer on this dose? If not, okay to stop and f/u with Dr. Arbutus Leas. Thanks

## 2022-10-05 DIAGNOSIS — H31001 Unspecified chorioretinal scars, right eye: Secondary | ICD-10-CM | POA: Diagnosis not present

## 2022-10-05 DIAGNOSIS — H43811 Vitreous degeneration, right eye: Secondary | ICD-10-CM | POA: Diagnosis not present

## 2022-10-31 DIAGNOSIS — I10 Essential (primary) hypertension: Secondary | ICD-10-CM | POA: Diagnosis not present

## 2022-10-31 DIAGNOSIS — Z299 Encounter for prophylactic measures, unspecified: Secondary | ICD-10-CM | POA: Diagnosis not present

## 2022-10-31 DIAGNOSIS — E1165 Type 2 diabetes mellitus with hyperglycemia: Secondary | ICD-10-CM | POA: Diagnosis not present
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
## 2022-12-06 ENCOUNTER — Ambulatory Visit: Payer: Medicare HMO | Admitting: Neurology

## 2022-12-06 ENCOUNTER — Encounter: Payer: Self-pay | Admitting: Neurology

## 2022-12-06 VITALS — BP 146/90 | HR 62 | Ht 67.0 in | Wt 199.6 lb

## 2022-12-06 DIAGNOSIS — G20A1 Parkinson's disease without dyskinesia, without mention of fluctuations: Secondary | ICD-10-CM

## 2022-12-06 DIAGNOSIS — G4752 REM sleep behavior disorder: Secondary | ICD-10-CM | POA: Diagnosis not present

## 2022-12-06 MED ORDER — CARBIDOPA-LEVODOPA ER 25-100 MG PO TBCR: 1.0000 | EXTENDED_RELEASE_TABLET | Freq: Three times a day (TID) | ORAL | 1 refills | Status: DC

## 2022-12-06 NOTE — Patient Instructions (Addendum)
Get bedrails for the bed on [facility] carbidopa/levodopa 25/100 CR, 1 tablet at 7am x 1 week, then 1 tablet at 7am and 11am x 1 week, and then 1 at 7am/11am/4pm    As a reminder, carbidopa/levodopa can be taken at the same time as a carbohydrate, but we like to have you take your pill either 30 minutes before a protein source or 1 hour after as protein can interfere with carbidopa/levodopa absorption.   SAVE THE DATE!  We are planning a Parkinsons Disease educational symposium at [facility] in [place] on October 11.  More details to come!  If you would like to be added to our email list to get further information, email [email or web address].  We hope to see you there!

## 2023-01-08 DIAGNOSIS — K760 Fatty (change of) liver, not elsewhere classified: Secondary | ICD-10-CM | POA: Diagnosis not present

## 2023-01-08 DIAGNOSIS — Z809 Family history of malignant neoplasm, unspecified: Secondary | ICD-10-CM | POA: Diagnosis not present

## 2023-01-08 DIAGNOSIS — I2510 Atherosclerotic heart disease of native coronary artery without angina pectoris: Secondary | ICD-10-CM | POA: Diagnosis not present

## 2023-01-08 DIAGNOSIS — G20A1 Parkinson's disease without dyskinesia, without mention of fluctuations: Secondary | ICD-10-CM | POA: Diagnosis not present

## 2023-01-08 DIAGNOSIS — Z7984 Long term (current) use of oral hypoglycemic drugs: Secondary | ICD-10-CM | POA: Diagnosis not present

## 2023-01-08 DIAGNOSIS — Z8249 Family history of ischemic heart disease and other diseases of the circulatory system: Secondary | ICD-10-CM | POA: Diagnosis not present

## 2023-01-08 DIAGNOSIS — E669 Obesity, unspecified: Secondary | ICD-10-CM | POA: Diagnosis not present

## 2023-01-08 DIAGNOSIS — E785 Hyperlipidemia, unspecified: Secondary | ICD-10-CM | POA: Diagnosis not present

## 2023-01-08 DIAGNOSIS — Z791 Long term (current) use of non-steroidal anti-inflammatories (NSAID): Secondary | ICD-10-CM | POA: Diagnosis not present

## 2023-01-08 DIAGNOSIS — M1990 Unspecified osteoarthritis, unspecified site: Secondary | ICD-10-CM | POA: Diagnosis not present

## 2023-01-08 DIAGNOSIS — E119 Type 2 diabetes mellitus without complications: Secondary | ICD-10-CM | POA: Diagnosis not present

## 2023-01-08 DIAGNOSIS — Z008 Encounter for other general examination: Secondary | ICD-10-CM | POA: Diagnosis not present

## 2023-01-08 DIAGNOSIS — I10 Essential (primary) hypertension: Secondary | ICD-10-CM | POA: Diagnosis not present

## 2023-02-07 DIAGNOSIS — H31002 Unspecified chorioretinal scars, left eye: Secondary | ICD-10-CM | POA: Diagnosis not present

## 2023-02-07 DIAGNOSIS — E119 Type 2 diabetes mellitus without complications: Secondary | ICD-10-CM | POA: Diagnosis not present

## 2023-02-07 DIAGNOSIS — H5203 Hypermetropia, bilateral: Secondary | ICD-10-CM | POA: Diagnosis not present

## 2023-02-07 DIAGNOSIS — H02831 Dermatochalasis of right upper eyelid: Secondary | ICD-10-CM | POA: Diagnosis not present

## 2023-02-07 DIAGNOSIS — H02834 Dermatochalasis of left upper eyelid: Secondary | ICD-10-CM | POA: Diagnosis not present

## 2023-03-22 NOTE — Progress Notes (Unsigned)
Assessment/Plan:   1.  Parkinsons Disease, diagnosed March, 2024  -Continue carbidopa/levodopa 25/100 CR, 1 po tid.  Had dizziness/fatigue with IR.  Did better with the CR, but did not take it today, so I really could not see how it is working.  He also needs to move the dosages closer together.  Gave him a schedule so that he takes it at 6 AM to 7 AM/10 AM to 11 AM/3 PM to 4 PM.    -he asked about focused ultrasound.  We discussed this as well as DBS.  Doesn't need it yet but may in the future  -We discussed that it used to be thought that levodopa would increase risk of melanoma but now it is believed that Parkinsons itself likely increases risk of melanoma. he is to get regular skin checks.  He reports he is doing this.  2.  RBD  -less active lately and he is not ready to start medication  -discussed bed rails   Subjective:   [name] was seen today in follow up for Parkinsons disease.  This patient is accompanied in the office by his child/sonwho supplements the history.  Last visit, we changed the patient to the extended release version of levodopa, primarily because of dizziness and fatigue.  He reports that the dizziness is better.  He had one day of dizziness.  He has not had falls.  No lightheadedness or near syncope.  Last visit, we did talk about adding bed rails for active dreams and he states that dreams haven't been very active lately.  Current prescribed movement disorder medications: Carbidopa/levodopa 25/100 CR, 1 tablet 3 times per day (started last visit)    PREVIOUS MEDICATIONS: carbidopa/levodopa IR (dizziness/fatigue)  ALLERGIES:   Allergies  Allergen Reactions   Lisinopril Cough   Valsartan Cough   Hydrochlorothiazide Rash   Metoprolol Rash    CURRENT MEDICATIONS:  Current Meds  Medication Sig   amLODipine (NORVASC) 2.5 MG tablet Take 2.5 mg by mouth daily.   Carbidopa-Levodopa ER (SINEMET CR) 25-100 MG tablet controlled release Take 1 tablet  by mouth 3 (three) times daily. 7am/11am/4pm   metFORMIN (GLUCOPHAGE) 500 MG tablet Take 500 mg by mouth daily with breakfast.     Objective:   PHYSICAL EXAMINATION:    VITALS:   Vitals:   03/26/23 1034  BP: 138/88  Pulse: 77  SpO2: 97%  Weight: 202 lb 6.4 oz (91.8 kg)  Height: 5\' 7"  (1.702 m)     GEN:  The patient appears stated age and is in NAD. HEENT:  Normocephalic, atraumatic.  The mucous membranes are moist. The superficial temporal arteries are without ropiness or tenderness.   Neurological examination:  Orientation: The patient is alert and oriented x3. Cranial nerves: There is good facial symmetry with mild facial hypomimia. The speech is fluent and clear. Soft palate rises symmetrically and there is no tongue deviation. Hearing is intact to conversational tone. Sensation: Sensation is intact to light touch throughout Motor: Strength is at least antigravity x4.  Patient has not taken any of his levodopa today and was seen at 10:45 AM  Movement examination: Tone: There is mild increased tone in the LUE Abnormal movements: there is LUE/LLE rest tremor and a very rare right upper extremity rest tremor (patient states that this just started) Coordination:  There is no decremation with RAM's, with any form of RAMS, including alternating supination and pronation of the forearm, hand opening and closing, finger taps, heel taps and  toe taps.  Gait and Station: The patient has no difficulty arising out of a deep-seated chair without the use of the hands.     I have reviewed and interpreted the following labs independently    Chemistry      Component Value Date/Time   NA 140 12/14/2014 1559   K 4.0 12/14/2014 1559   CL 104 12/14/2014 1559   CO2 28 12/14/2014 1559   BUN 13 12/14/2014 1559   CREATININE 1.15 12/14/2014 1559      Component Value Date/Time   CALCIUM 10.2 12/14/2014 1559   ALKPHOS 46 10/18/2008 0134   AST 37 10/18/2008 0134   ALT 48 10/18/2008 0134    BILITOT 0.4 10/18/2008 0134       Lab Results  Component Value Date   WBC 8.1 12/14/2014   HGB 16.1 12/14/2014   HCT 47.4 12/14/2014   MCV 85.3 12/14/2014   PLT 180.0 12/14/2014    No results found for: "TSH"   Total time spent on today's visit was 20 minutes, including both face-to-face time and nonface-to-face time.  Time included that spent on review of records (prior notes available to me/labs/imaging if pertinent), discussing treatment and goals, answering patient's questions and coordinating care.  Cc:  Kirstie Peri, MD

## 2023-03-26 ENCOUNTER — Ambulatory Visit: Payer: Medicare HMO | Admitting: Neurology

## 2023-03-26 ENCOUNTER — Encounter: Payer: Self-pay | Admitting: Neurology

## 2023-03-26 VITALS — BP 138/88 | HR 77 | Ht 67.0 in | Wt 202.4 lb

## 2023-03-26 DIAGNOSIS — G20A1 Parkinson's disease without dyskinesia, without mention of fluctuations: Secondary | ICD-10-CM | POA: Diagnosis not present

## 2023-03-26 DIAGNOSIS — G4752 REM sleep behavior disorder: Secondary | ICD-10-CM | POA: Diagnosis not present

## 2023-03-26 NOTE — Patient Instructions (Signed)
Move your carbidopa/levodopa 25/100 CR to 6am/10am/3-4pm  The physicians and staff at [facility] are committed to providing excellent care. You may receive a survey requesting feedback about your experience at our office. We strive to receive "very good" responses to the survey questions. If you feel that your experience would prevent you from giving the office a "very good " response, please contact our office to try to remedy the situation. We may be reached at [phone]. Thank you for taking the time out of your busy day to complete the survey.

## 2023-04-04 ENCOUNTER — Ambulatory Visit: Payer: Medicare HMO | Admitting: Neurology

## 2023-05-27 ENCOUNTER — Other Ambulatory Visit: Payer: Self-pay | Admitting: Neurology

## 2023-05-27 DIAGNOSIS — G20A1 Parkinson's disease without dyskinesia, without mention of fluctuations: Secondary | ICD-10-CM

## 2023-09-23 NOTE — Progress Notes (Unsigned)
 Assessment/Plan:   1.  Parkinsons Disease, diagnosed March, 2024  -Increase carbidopa /levodopa  25/100 CR, 2/1/1  Had dizziness/fatigue with IR.   Gave him a schedule so that he takes it at 6 AM to 7 AM/10 AM to 11 AM/3 PM to 4 PM.    -he asked about focused ultrasound.  We discussed this as well as DBS.  Doesn't need it yet but may in the future  -We discussed that it used to be thought that levodopa  would increase risk of melanoma but now it is believed that Parkinsons itself likely increases risk of melanoma. he is to get regular skin checks.  He needs to do this (previously told me he did this but apparently hasn't)  2.  RBD  -less active lately and he is not ready to start medication  -discussed bed rails  3.  Memory Loss  -suspect [facility]  -do neurocog testing  -MRI brain  Subjective:   [name] was seen today in follow up for Parkinsons disease.  This patient is accompanied in the office by his child/sonwho supplements the history.  Last visit, we discussed timing of the levodopa  and he reports today that he takes the last one at 6pm.  No falls since last visit.  No lightheadedness or near syncope.  They feel like he is having cognitive issues; he was at church and does some type of routine there and he forgot part of the routine (he forgot to introduce the speaker) and he does that weekly.  His son states that he is slower to understand than in the past.  No day hallucinations but there was one incident that he thought that there was an animal in the bed but may have been dreaming.  He walks for exercise.    Current prescribed movement disorder medications: Carbidopa /levodopa  25/100 CR, 1 tablet 3 times per day ,  6 AM to 7 AM/10 AM to 11 AM/3 PM to 4 PM.   PREVIOUS MEDICATIONS: carbidopa /levodopa  IR (dizziness/fatigue)  ALLERGIES:   Allergies  Allergen Reactions   Lisinopril Cough   Valsartan  Cough   Hydrochlorothiazide  Rash   Metoprolol Rash    CURRENT  MEDICATIONS:  Current Meds  Medication Sig   amLODipine (NORVASC) 2.5 MG tablet Take 2.5 mg by mouth daily.   Carbidopa -Levodopa  ER (SINEMET  CR) 25-100 MG tablet controlled release TAKE 1 TABLET BY MOUTH 3 (THREE) TIMES DAILY. 7AM/11AM/4PM   metFORMIN (GLUCOPHAGE) 500 MG tablet Take 500 mg by mouth daily with breakfast.     Objective:   PHYSICAL EXAMINATION:    VITALS:   Vitals:   09/24/23 1112  BP: 124/76  Pulse: 64  SpO2: 98%  Weight: 202 lb 12.8 oz (92 kg)  Height: 5\' 7"  (1.702 m)    GEN:  The patient appears stated age and is in NAD. HEENT:  Normocephalic, atraumatic.  The mucous membranes are moist. The superficial temporal arteries are without ropiness or tenderness. Cardiovascular: Regular rate rhythm Lungs: Clear to auscultation bilaterally Neck: No bruits  Neurological examination:  Orientation: The patient is alert and oriented x3. Cranial nerves: There is good facial symmetry with mild facial hypomimia. The speech is fluent and clear. Soft palate rises symmetrically and there is no tongue deviation. Hearing is intact to conversational tone. Sensation: Sensation is intact to light touch throughout Motor: Strength is at least antigravity x4.  Movement examination: Tone: There is mild increased tone in the LUE Abnormal movements: there is LUE/LLE rest tremor.  None seen on  the right today. Coordination:  There is no decremation with RAM's, with any form of RAMS, including alternating supination and pronation of the forearm, hand opening and closing, finger taps, heel taps and toe taps.  Gait and Station: The patient has no difficulty arising out of a deep-seated chair without the use of the hands.  He ambulates well in the hall.    I have reviewed and interpreted the following labs independently    Chemistry      Component Value Date/Time   NA 140 12/14/2014 1559   K 4.0 12/14/2014 1559   CL 104 12/14/2014 1559   CO2 28 12/14/2014 1559   BUN 13 12/14/2014  1559   CREATININE 1.15 12/14/2014 1559      Component Value Date/Time   CALCIUM 10.2 12/14/2014 1559   ALKPHOS 46 10/18/2008 0134   AST 37 10/18/2008 0134   ALT 48 10/18/2008 0134   BILITOT 0.4 10/18/2008 0134       Lab Results  Component Value Date   WBC 8.1 12/14/2014   HGB 16.1 12/14/2014   HCT 47.4 12/14/2014   MCV 85.3 12/14/2014   PLT 180.0 12/14/2014    No results found for: "TSH"   Total time spent on today's visit was 30 minutes, including both face-to-face time and nonface-to-face time.  Time included that spent on review of records (prior notes available to me/labs/imaging if pertinent), discussing treatment and goals, answering patient's questions and coordinating care.  Cc:  Theoplis Fix, MD

## 2023-09-24 ENCOUNTER — Encounter: Payer: Self-pay | Admitting: Neurology

## 2023-09-24 ENCOUNTER — Ambulatory Visit: Payer: Self-pay | Admitting: Neurology

## 2023-09-24 VITALS — BP 124/76 | HR 64 | Ht 67.0 in | Wt 202.8 lb

## 2023-09-24 DIAGNOSIS — R413 Other amnesia: Secondary | ICD-10-CM | POA: Diagnosis not present

## 2023-09-24 DIAGNOSIS — G20A1 Parkinson's disease without dyskinesia, without mention of fluctuations: Secondary | ICD-10-CM | POA: Diagnosis not present

## 2023-09-24 MED ORDER — CARBIDOPA-LEVODOPA ER 25-100 MG PO TBCR: EXTENDED_RELEASE_TABLET | ORAL | 2 refills | Status: AC

## 2023-09-24 NOTE — Patient Instructions (Addendum)
 Increase carbidopa /levodopa  25/100 CR, 2 at 7am, 1 at 11am, 1 at 4pm  As we discussed, it used to be thought that levodopa  would increase risk of melanoma but now it is believed that Parkinsons itself likely increases risk of melanoma. I recommend yearly skin checks with a board certified dermatologist.    [facility] Locations: [facility] [address] [place], [place] [address] [phone]  [facility] Address: [address] [place], [place], [place] [address] Phone: [phone]  [facility] [address] [place], [place], [place] Phone: [phone]  [facility] [address], [place], [place] [address] Phone: [phone]  [place]: [facility] [address] [place], [place] [address] Phone: [phone]  [facility] [address] [place], [place], [place] [address] Phone: [phone]  [place]: [facility] [address], [place], [place] [address] Phone: [phone]

## 2023-09-26 ENCOUNTER — Telehealth: Payer: Self-pay

## 2023-09-26 NOTE — Telephone Encounter (Signed)
 Prior Auth completed # [id number] Valid Sep 26 2023 - March 24 2024 Called patient and patients wife number to schedule MRI and give them the number for central scheduling [phone]

## 2023-10-03 ENCOUNTER — Ambulatory Visit (HOSPITAL_COMMUNITY)
Admission: RE | Admit: 2023-10-03 | Discharge: 2023-10-03 | Disposition: A | Discharge status: Home / Self Care | Source: Ambulatory Visit | Attending: Neurology | Admitting: Neurology

## 2023-10-03 ENCOUNTER — Telehealth: Payer: Self-pay | Admitting: Neurology

## 2023-10-03 ENCOUNTER — Other Ambulatory Visit (HOSPITAL_COMMUNITY): Payer: Self-pay | Admitting: Neurology

## 2023-10-03 ENCOUNTER — Other Ambulatory Visit: Payer: Self-pay | Admitting: Neurology

## 2023-10-03 ENCOUNTER — Other Ambulatory Visit: Payer: Self-pay

## 2023-10-03 DIAGNOSIS — R413 Other amnesia: Secondary | ICD-10-CM | POA: Insufficient documentation

## 2023-10-03 DIAGNOSIS — H579 Unspecified disorder of eye and adnexa: Secondary | ICD-10-CM | POA: Diagnosis not present

## 2023-10-03 DIAGNOSIS — Z1389 Encounter for screening for other disorder: Secondary | ICD-10-CM | POA: Diagnosis not present

## 2023-10-03 DIAGNOSIS — J320 Chronic maxillary sinusitis: Secondary | ICD-10-CM | POA: Diagnosis not present

## 2023-10-03 DIAGNOSIS — G319 Degenerative disease of nervous system, unspecified: Secondary | ICD-10-CM | POA: Diagnosis not present

## 2023-10-03 DIAGNOSIS — I6782 Cerebral ischemia: Secondary | ICD-10-CM | POA: Diagnosis not present

## 2023-10-03 NOTE — Telephone Encounter (Signed)
 Pt. Has metal object behind eye and needs new order for MRI, please call back ASAP

## 2023-10-03 NOTE — Telephone Encounter (Signed)
 Called and they have orders

## 2023-10-08 ENCOUNTER — Ambulatory Visit: Payer: Self-pay | Admitting: Neurology

## 2023-11-05 ENCOUNTER — Encounter: Payer: Self-pay | Admitting: Psychology

## 2023-11-18 ENCOUNTER — Ambulatory Visit: Payer: Self-pay

## 2023-11-18 ENCOUNTER — Ambulatory Visit: Admitting: Psychology

## 2023-11-18 DIAGNOSIS — R4189 Other symptoms and signs involving cognitive functions and awareness: Secondary | ICD-10-CM

## 2023-11-18 DIAGNOSIS — G3184 Mild cognitive impairment, so stated: Secondary | ICD-10-CM | POA: Diagnosis not present

## 2023-11-18 NOTE — Progress Notes (Signed)
   Psychometrician Note   Cognitive testing was administered to [name] by Luke Pitcher, B.S. (psychometrist) under the supervision of Dr. Renda Beckwith, Psy.D., licensed psychologist on 11/18/2023. [name] did not appear overtly distressed by the testing session per behavioral observation or responses across self-report questionnaires. Rest breaks were offered.    The battery of tests administered was selected by Dr. Renda Beckwith, Psy.D. with consideration to [name]'s current level of functioning, the nature of his symptoms, emotional and behavioral responses during interview, level of literacy, observed level of motivation/effort, and the nature of the referral question. This battery was communicated to the psychometrist. Communication between Dr. Renda Beckwith, Psy.D. and the psychometrist was ongoing throughout the evaluation and Dr. Renda Beckwith, Psy.D. was immediately accessible at all times. Dr. Renda Beckwith, Psy.D. provided supervision to the psychometrist on the date of this service to the extent necessary to assure the quality of all services provided.    [name] will return within approximately 1-2 weeks for an interactive feedback session with Dr. Beckwith at which time his test performances, clinical impressions, and treatment recommendations will be reviewed in detail. [name] understands he can contact our office should he require our assistance before this time.  A total of 120 minutes of billable time were spent face-to-face with [name] by the psychometrist. This includes both test administration and scoring time. Billing for these services is reflected in the clinical report generated by Dr. Renda Beckwith, Psy.D.  This note reflects time spent with the psychometrician and does not include test scores or any clinical interpretations made by Dr. Beckwith. The full report will follow in a separate note.
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
## 2023-11-26 ENCOUNTER — Ambulatory Visit: Admitting: Psychology

## 2023-11-26 DIAGNOSIS — G3184 Mild cognitive impairment, so stated: Secondary | ICD-10-CM

## 2023-11-26 NOTE — Progress Notes (Signed)
   NEUROPSYCHOLOGY FEEDBACK SESSION [name]. [facility]  [place] Department of Neurology  Date of Feedback Session: 11/26/2023  REASON FOR REFERRAL   [name] is a 73 year old, right-handed, White male with 12 years of formal education. He was referred for neuropsychological evaluation by his neurologist, Asberry Tat, D.O., to assess current neurocognitive functioning, document potential cognitive deficits, and assist with treatment planning in the setting of Parkinson's disease (first diagnosed in March 2024). This is his first neuropsychological evaluation.  FEEDBACK   Patient completed a comprehensive neuropsychological evaluation on 11/18/2023. Please refer to that encounter for the full report and recommendations. Briefly, results indicated deficits primarily in working memory, executive functioning, and aspects of learning/memory (i.e., frontal-subcortical memory profile). Visuospatial abilities were also below expectations, though distinguishing this from the impact of visual impairment is challenging. These deficits are present in the setting of preserved functional independence, supporting a diagnosis of mild cognitive impairment. Considering pertinent background information in combination with today's assessment, the overall cognitive profile is most consistent with known Parkinson's disease, though moderate cerebrovascular pathology may also be contributory. At this time, his profile does not strongly suggest a coexisting Alzheimer's disease process.   Today, the patient was accompanied by his son. They were provided verbal feedback regarding the findings and impression during this visit, and their questions were answered. A copy of the report was provided at the conclusion of the visit.   DISPOSITION   Patient will follow up with the referring provider, Dr. Evonnie. No follow-up neuropsychological testing was scheduled at this time. Please feel free to refer the patient  for repeated evaluation if he shows a significant change in neurocognitive status.  SERVICE   This feedback session was conducted by Renda Beckwith, Psy.D. One unit of 03867 (34 minutes) was billed for Dr. Beckwith' time spent in preparing, conducting, and documenting the current feedback session.  This report was generated using voice recognition software. While this document has been carefully reviewed, transcription errors may be present. I apologize in advance for any inconvenience. Please contact me if further clarification is needed.

## 2023-12-18 DIAGNOSIS — R5383 Other fatigue: Secondary | ICD-10-CM | POA: Diagnosis not present

## 2023-12-18 DIAGNOSIS — Z0000 Encounter for general adult medical examination without abnormal findings: Secondary | ICD-10-CM | POA: Diagnosis not present

## 2023-12-18 DIAGNOSIS — Z125 Encounter for screening for malignant neoplasm of prostate: Secondary | ICD-10-CM | POA: Diagnosis not present

## 2023-12-18 DIAGNOSIS — Z79899 Other long term (current) drug therapy: Secondary | ICD-10-CM | POA: Diagnosis not present

## 2023-12-18 DIAGNOSIS — E7800 Pure hypercholesterolemia, unspecified: Secondary | ICD-10-CM | POA: Diagnosis not present

## 2024-02-03 DIAGNOSIS — D225 Melanocytic nevi of trunk: Secondary | ICD-10-CM | POA: Diagnosis not present

## 2024-02-03 DIAGNOSIS — L570 Actinic keratosis: Secondary | ICD-10-CM | POA: Diagnosis not present

## 2024-02-03 DIAGNOSIS — X32XXXA Exposure to sunlight, initial encounter: Secondary | ICD-10-CM | POA: Diagnosis not present

## 2024-02-19 NOTE — Progress Notes (Signed)
 [name]                                          MRN: [id number]   02/19/2024   The [facility] Quality Team Specialist reviewed this patient medical record for the purposes of chart review for care gap closure. The following were reviewed: chart review for care gap closure-glycemic status assessment.    [facility] Quality Team

## 2024-03-30 NOTE — Progress Notes (Unsigned)
 Assessment/Plan:   1.  Parkinsons Disease, diagnosed March, 2024  -continue carbidopa /levodopa  25/100 CR, 2/1/1  Had dizziness/fatigue with IR.   Gave him a schedule so that he takes it at 6 AM to 7 AM/10 AM to 11 AM/3 PM to 4 PM.    -he asked about focused ultrasound.  We discussed this as well as DBS.  Doesn't need it yet but may in the future  -We discussed that it used to be thought that levodopa  would increase risk of melanoma but now it is believed that Parkinsons itself likely increases risk of melanoma. he is to get regular skin checks.  He needs to do this (previously told me he did this but apparently hasn't)  2.  RBD  -less active lately and he is not ready to start medication  -discussed bed rails  3.  MCI  - Neurocognitive testing with Dr. Gayland November 18, 2023 with evidence of MCI  4.  Dizziness  -just started again, although has had in past.  He thinks that its not same as in the past  -?mild bppv - pcp just gave him meclizine.  Told him to take regularly for few days and see how he does  -increase hydration  5.   Urinary frequency  -has urologist that he has seen for nephrolithiasis in past.  F/u urology.  Likely bph component Subjective:   [name] was seen today in follow up for Parkinsons disease.  This patient is accompanied in the office by his wife who supplements the history.  I did increase his levodopa  just a little bit last visit.  He tolerated that well, without side effects.  Has dizziness but states that this is new and only since Tuesday - not spinning.  Saw PCP and didn't note anything.  Given meclizine and it helped a little.  It is better today.   MRI brain was completed since last visit and there was moderate small vessel disease and severe right maxillary sinus disease.  He had neurocognitive testing in June, 2025 with evidence of MCI.  No falls.  Having some urinary frequency  Current prescribed movement disorder  medications: Carbidopa /levodopa  25/100 CR, 2/1/1 (increased)   PREVIOUS MEDICATIONS: carbidopa /levodopa  IR (dizziness/fatigue)  ALLERGIES:   Allergies  Allergen Reactions   Lisinopril Cough   Valsartan  Cough   Hydrochlorothiazide  Rash   Metoprolol Rash    CURRENT MEDICATIONS:  Current Meds  Medication Sig   amLODipine (NORVASC) 2.5 MG tablet Take 2.5 mg by mouth daily.   Carbidopa -Levodopa  ER (SINEMET  CR) 25-100 MG tablet controlled release 2 at 7am, 1 at 11am, 1 at 4pm   metFORMIN (GLUCOPHAGE) 500 MG tablet Take 500 mg by mouth daily with breakfast.     Objective:   PHYSICAL EXAMINATION:    VITALS:   Vitals:   04/02/24 0831  BP: (!) 166/82  Pulse: 62  SpO2: 95%  Weight: 202 lb (91.6 kg)    GEN:  The patient appears stated age and is in NAD. HEENT:  Normocephalic, atraumatic.  The mucous membranes are moist. The superficial temporal arteries are without ropiness or tenderness. Cardiovascular: Regular rate rhythm Lungs: Clear to auscultation bilaterally Neck: No bruits  Neurological examination:  Orientation: The patient is alert and oriented x3. Cranial nerves: There is good facial symmetry with mild facial hypomimia. The speech is fluent and clear. Soft palate rises symmetrically and there is no tongue deviation. Hearing is intact to conversational tone. Sensation: Sensation is intact to light touch  throughout Motor: Strength is at least antigravity x4.  Seen at 8:45 am but hasn't taken medication yet.    Movement examination: Tone: There is mod increased tone in the RUE Abnormal movements: there is LUE rest tremor, mod, near constant.  There is rare RUE rest tremor and jaw tremor Coordination:  There is mod decremation with toe taps on the L Gait and Station: The patient has no difficulty arising out of a deep-seated chair without the use of the hands.  He ambulates well in the hall.    I have reviewed and interpreted the following labs independently     Chemistry      Component Value Date/Time   NA 140 12/14/2014 1559   K 4.0 12/14/2014 1559   CL 104 12/14/2014 1559   CO2 28 12/14/2014 1559   BUN 13 12/14/2014 1559   CREATININE 1.15 12/14/2014 1559      Component Value Date/Time   CALCIUM 10.2 12/14/2014 1559   ALKPHOS 46 10/18/2008 0134   AST 37 10/18/2008 0134   ALT 48 10/18/2008 0134   BILITOT 0.4 10/18/2008 0134       Lab Results  Component Value Date   WBC 8.1 12/14/2014   HGB 16.1 12/14/2014   HCT 47.4 12/14/2014   MCV 85.3 12/14/2014   PLT 180.0 12/14/2014    No results found for: TSH   Total time spent on today's visit was 30 minutes, including both face-to-face time and nonface-to-face time.  Time included that spent on review of records (prior notes available to me/labs/imaging if pertinent), discussing treatment and goals, answering patient's questions and coordinating care.  Cc:  Maree Isles, MD

## 2024-04-02 ENCOUNTER — Encounter: Payer: Self-pay | Admitting: Neurology

## 2024-04-02 ENCOUNTER — Ambulatory Visit: Admitting: Neurology

## 2024-04-02 VITALS — BP 166/82 | HR 62 | Wt 202.0 lb

## 2024-04-02 DIAGNOSIS — G3184 Mild cognitive impairment, so stated: Secondary | ICD-10-CM

## 2024-04-02 DIAGNOSIS — R350 Frequency of micturition: Secondary | ICD-10-CM

## 2024-04-02 DIAGNOSIS — G20A1 Parkinson's disease without dyskinesia, without mention of fluctuations: Secondary | ICD-10-CM

## 2024-04-02 NOTE — Patient Instructions (Signed)
 Exercise! Take your meclizine for a few days. Increase water   DIETARY GUIDELINES Research suggests that Parkinson's disease symptoms might be improved by increasing the levels of anti-oxidant and anti-inflammatory compounds in your brain. This means eating more whole, unprocessed plant foods. The more variety and color in your diet the better! Don't spend a lot of money on supplements, spend it on fresh healthy food; food is medicine! Eat what you need to eat to be happy, but eat it as a 'treat', not as a staple food, and eat a lot more of the food that is good for your health. Every healthy lifestyle change helps and most people do better making gradual minor changes that become major over time.  1) Eat at least 7-9 servings of fruits and vegetables a day, including:  Eat lots more berries (blueberries, cherries, raspberries, goji berries, cranberries) and fruits (apples, pears, oranges, plums, prunes, apricots, peaches, nectarines, watermelon, grapefruit, pineapple, bananas) fresh, frozen or dried, all are healthy options.   Eat lots more dark leafy greens (spinach, romaine lettuce, arugula, Swiss chard, turnip greens, radicchio), cruciferous vegetables (broccoli, cauliflower, cabbage, red cabbage, collard greens, kale, brussel sprouts) and allium vegetables (onion, shallot, garlic, spring onion). Cut these vegetables up in small pieces and let them sit for a few minutes before steaming or microwaving.   Eat lots more colorful vegetables like beets, asparagus, red pepper, tomatoes, carrots, sweet potato, squash, pumpkin and other vegetables that are bright green, red, yellow and don't always eat the same ones. If you are cooking them, steam them or microwave them.  2) On your salads, make your own dressing and include a source of healthy fat (almonds, flax seeds, walnuts, cashews, tahini, sunflower seeds, extra virgin olive oil) to help absorb the anti-oxidants.  3) Drink more liquid including lots of  tea (white tea, green tea, rooibos tea, hibiscus tea, rosehip tea) and less dairy milk, soda, and clear fruit juice. Coffee, beer and wine are good too, in moderation. 4) Eat LOTS more spices (turmeric, pepper, cloves, nutmeg, oregano, basil, [place] cinnamon, thyme, rosemary, ginger). Include these in your daily oatmeal, salads, roasted vegetables, etc. Try traditional blends of spices from different cultures. 5) Eat at least 1-2 tablespoons ground flax seeds daily. Ideally, buy them whole and grind them yourself as needed. Keep them and most foods with high fat refrigerated. 6) Eat more mushrooms, cooked. Any kind of mushroom is fine. 7) Don't eat lots of fish and if you do, eat [place] wild caught salmon or small fatty fishes like mackerel and sardines. If you want to take supplemental omega-3 fatty acids, instead of fish oil, consider algae-derived omega-3 supplements which cost more, but may be better for you because they don't have the pollutants that are in most fish oil. 8) Eat lots more legumes which include kidney beans, black beans, garbanzo beans, black-eyed peas, fava beans, navy beans, white beans, broad beans, peanuts, green lentils, red lentils, tofu, miso, edamame, soybeans, tempeh, etc. 9) Eat lots more whole grains (oatmeal, brown rice, quinoa, buckwheat, barley, spelt, faro, bulgur wheat, whole wheat or multigrain bread, whole wheat pasta, soba or buckwheat noodles etc.) and less processed grains and grain products (white rice, white bread, enriched flour, fortified flour, cakes and pastries, products that say 'made with whole grains').  10) Try to eat enough whole plant foods, with enough dietary fiber to have at least one bowel movement a day, without stool softeners or laxatives.  11) If you become a vegan, (which means giving  up meat, poultry, fish AND dairy products) consult your physician and take B 12 supplements (2500mcg once/week).    Supplements in Parkinson Disease Don't  spend a lot of money on supplements, spend it on fresh healthy food; food is medicine! The dietary guidelines above are safe and effective for people with PD (and may reduce the risk of developing PD for people at higher risk). A brief note on mucuna: Mucuna pruriens is a legume that contains L-dopa. Some health food stores and online retailers sell mucuna extract in powdered or pill form, as an alternative medicine -- but it's not really alternative, it's just L-dopa! The amount of L-dopa found in mucuna is not standardized, and the extract may also contain fillers that can be at worst, directly toxic to the brain. I do not recommend taking mucuna instead of levodopa . A brief note on CBD: Cannabidiols (CBD) seems to be helpful for the management of stress and anxiety in Parkinson disease. On the downside, it might worsen apathy and lack of motivation. It does not seem to have an effect on the underlying disease course. Unfortunately, until it's federally legal, we will not be able to study it in research trials and figure out a standardized dosing strategy. If you choose to try CBD oil, try to find a strain that has minimal/no THC -- this will minimize the high of marijuana but will still lessen your anxiety

## 2024-05-29 ENCOUNTER — Encounter: Payer: Self-pay | Admitting: Neurology

## 2024-10-01 ENCOUNTER — Ambulatory Visit: Admitting: Neurology

## 2024-10-15 ENCOUNTER — Ambulatory Visit: Admitting: Neurology
